# Patient Record
Sex: Male | Born: 1980 | Race: Black or African American | Hispanic: No | Marital: Married | Smoking: Current every day smoker
Health system: Southern US, Community
[De-identification: ages and names within clinical notes are randomized; demographics above are authoritative.]

---

## 1999-08-13 ENCOUNTER — Emergency Department (HOSPITAL_COMMUNITY): Admission: EM | Admit: 1999-08-13 | Discharge: 1999-08-13 | Payer: Self-pay | Admitting: Emergency Medicine

## 2006-06-04 ENCOUNTER — Emergency Department: Payer: Self-pay | Admitting: Emergency Medicine

## 2009-01-22 ENCOUNTER — Emergency Department: Payer: Self-pay | Admitting: Emergency Medicine

## 2009-08-19 ENCOUNTER — Emergency Department: Payer: Self-pay | Admitting: Unknown Physician Specialty

## 2016-01-18 DIAGNOSIS — M25561 Pain in right knee: Secondary | ICD-10-CM | POA: Insufficient documentation

## 2016-01-18 DIAGNOSIS — D649 Anemia, unspecified: Secondary | ICD-10-CM | POA: Insufficient documentation

## 2016-01-18 DIAGNOSIS — B353 Tinea pedis: Secondary | ICD-10-CM | POA: Insufficient documentation

## 2016-01-28 DIAGNOSIS — E559 Vitamin D deficiency, unspecified: Secondary | ICD-10-CM | POA: Insufficient documentation

## 2016-03-01 DIAGNOSIS — G8929 Other chronic pain: Secondary | ICD-10-CM | POA: Insufficient documentation

## 2016-06-11 DIAGNOSIS — M222X1 Patellofemoral disorders, right knee: Secondary | ICD-10-CM | POA: Insufficient documentation

## 2018-09-19 ENCOUNTER — Other Ambulatory Visit: Payer: Self-pay

## 2018-09-19 ENCOUNTER — Encounter: Payer: Self-pay | Admitting: Emergency Medicine

## 2018-09-19 ENCOUNTER — Emergency Department
Admission: EM | Admit: 2018-09-19 | Discharge: 2018-09-19 | Disposition: A | Discharge status: Home / Self Care | Payer: Self-pay | Attending: Emergency Medicine | Admitting: Emergency Medicine

## 2018-09-19 DIAGNOSIS — R05 Cough: Secondary | ICD-10-CM | POA: Insufficient documentation

## 2018-09-19 DIAGNOSIS — J111 Influenza due to unidentified influenza virus with other respiratory manifestations: Secondary | ICD-10-CM | POA: Insufficient documentation

## 2018-09-19 DIAGNOSIS — R0981 Nasal congestion: Secondary | ICD-10-CM | POA: Insufficient documentation

## 2018-09-19 DIAGNOSIS — M791 Myalgia, unspecified site: Secondary | ICD-10-CM | POA: Insufficient documentation

## 2018-09-19 MED ORDER — GUAIFENESIN-CODEINE 100-10 MG/5ML PO SOLN: 5.0000 mL | ORAL | 0 refills | Status: DC | PRN

## 2018-09-19 MED ORDER — OSELTAMIVIR PHOSPHATE 75 MG PO CAPS: 75.0000 mg | ORAL_CAPSULE | Freq: Two times a day (BID) | ORAL | 0 refills | Status: AC

## 2018-09-19 NOTE — ED Notes (Signed)
First Nurse Note: Patient complaining of "flu symptoms".  Wearing adult mask.

## 2018-09-19 NOTE — ED Triage Notes (Signed)
Pt to ED via POV c/o flu like symptoms since yesterday. Pt states that he has had cough and fever since yesterday. Pt states that when he coughs it burns his chest. Pt is in NAD at this time. Pt last took Advil yesterday.

## 2018-09-19 NOTE — ED Provider Notes (Signed)
[facility] Emergency Department Provider Note   ____________________________________________   First MD Initiated Contact with Patient 09/19/18 [phone]     (approximate)  I have reviewed the triage vital signs and the nursing notes.   HISTORY  Chief Complaint Fever   HPI Jonathon Perez is a 38 y.o. male presents to the ED with complaint of sudden onset yesterday of fever, body aches, cough and congestion.  He has had a subjective fever with chills.  He is taken some Advil with minimal relief.  He continues to have body aches.  He is unaware of any known exposure to flu.  He did not take the flu vaccine.  Currently rates his pain as a 4 out of 10.      History reviewed. No pertinent past medical history.  There are no active problems to display for this patient.   History reviewed. No pertinent surgical history.  Prior to Admission medications   Medication Sig Start Date End Date Taking? Authorizing Provider  guaiFENesin-codeine 100-10 MG/5ML syrup Take 5 mLs by mouth every 4 (four) hours as needed for cough. 09/19/18   Tommi Rumps, PA-C  oseltamivir (TAMIFLU) 75 MG capsule Take 1 capsule (75 mg total) by mouth 2 (two) times daily for 5 days. 09/19/18 09/24/18  Tommi Rumps, PA-C    Allergies Patient has no known allergies.  No family history on file.  Social History Social History   Tobacco Use  . Smoking status: Not on file  Substance Use Topics  . Alcohol use: Not on file  . Drug use: Not on file    Review of Systems Constitutional: Objective fever/chills Eyes: No visual changes. ENT: No sore throat. Cardiovascular: Denies chest pain. Respiratory: Denies shortness of breath.  Positive for cough. Gastrointestinal: No abdominal pain.  No nausea, no vomiting.  No diarrhea.  Genitourinary: Negative for dysuria. Musculoskeletal: Negative for back pain. Skin: Negative for rash. Neurological: Negative for headaches, focal  weakness or numbness. ___________________________________________   PHYSICAL EXAM:  VITAL SIGNS: ED Triage Vitals  Enc Vitals Group     BP 09/19/18 0744 (!) 96/54     Pulse Rate 09/19/18 0744 (!) 103     Resp 09/19/18 0744 18     Temp 09/19/18 0744 (!) 100.7 F (38.2 C)     Temp Source 09/19/18 0744 Oral     SpO2 09/19/18 0744 98 %     Weight 09/19/18 0752 160 lb (72.6 kg)     Height 09/19/18 0752 6' (1.829 m)     Head Circumference --      Peak Flow --      Pain Score 09/19/18 0752 4     Pain Loc --      Pain Edu? --      Excl. in GC? --    Constitutional: Alert and oriented. Well appearing and in no acute distress. Eyes: Conjunctivae are normal.  Head: Atraumatic. Nose: Minimal congestion/rhinnorhea.  TMs dull bilaterally. Mouth/Throat: Mucous membranes are moist.  Oropharynx non-erythematous. Neck: No stridor.   Hematological/Lymphatic/Immunilogical: No cervical lymphadenopathy. Cardiovascular: Normal rate, regular rhythm. Grossly normal heart sounds.  Good peripheral circulation. Respiratory: Normal respiratory effort.  No retractions. Lungs CTAB. Gastrointestinal: Soft and nontender. No distention.  Musculoskeletal: No lower extremity tenderness nor edema.  No joint effusions. Neurologic:  Normal speech and language. No gross focal neurologic deficits are appreciated.  Skin:  Skin is warm, dry and intact. No rash noted. Psychiatric: Mood and affect are normal.  Speech and behavior are normal.  ____________________________________________   LABS (all labs ordered are listed, but only abnormal results are displayed)  Labs Reviewed - No data to display ____________________________________________  PROCEDURES  Procedure(s) performed (including Critical Care):  Procedures   ____________________________________________   INITIAL IMPRESSION / ASSESSMENT AND PLAN / ED COURSE  As part of my medical decision making, I reviewed the following data within the  electronic MEDICAL RECORD NUMBER Notes from prior ED visits and [place] Controlled Substance Database     Resents to the ED with complaint of sudden onset of flulike symptoms.  This began yesterday.  Patient has taken Advil with minimal improvement of his symptoms.  Patient was given a prescription for Tamiflu for the next 5 days and Robitussin-AC.  He is encouraged to continue with Tylenol or Advil as needed for body aches, fever or headache.  Increase fluids.  And follow-up with his PCP or [facility] if any continued problems.  He was given a note to remain out of work.  ____________________________________________   FINAL CLINICAL IMPRESSION(S) / ED DIAGNOSES  Final diagnoses:  Influenza     ED Discharge Orders         Ordered    oseltamivir (TAMIFLU) 75 MG capsule  2 times daily     09/19/18 0832    guaiFENesin-codeine 100-10 MG/5ML syrup  Every 4 hours PRN     09/19/18 2446           Note:  This document was prepared using Dragon voice recognition software and may include unintentional dictation errors.    Tommi Rumps, PA-C 09/19/18 0919    Jene Every, MD 09/19/18 1209

## 2018-09-19 NOTE — Discharge Instructions (Addendum)
Follow-up with your primary care provider if any continued problems or can no clinic acute care.  Begin taking Tylenol or ibuprofen as needed for fever and body aches.  Increase fluids to stay hydrated.  A prescription for Tamiflu has been sent to your pharmacy as well as Robitussin-AC for cough.  The cough medication contains a narcotic and you should not take this and drive or operate machinery as it could cause increase in injuries.  While you are running fever you are contagious.  Avoid being around people while you are sick to prevent spread of influenza.

## 2018-09-19 NOTE — ED Notes (Signed)
See triage note  States he developed fever,body aches and slight cough  sxs' started yesterday  Low grade fever on arrival

## 2019-05-27 ENCOUNTER — Other Ambulatory Visit: Payer: Self-pay

## 2019-05-27 DIAGNOSIS — Z20822 Contact with and (suspected) exposure to covid-19: Secondary | ICD-10-CM

## 2019-05-28 LAB — NOVEL CORONAVIRUS, NAA: SARS-CoV-2, NAA: NOT DETECTED

## 2019-05-28 LAB — INPATIENT

## 2019-11-29 ENCOUNTER — Ambulatory Visit: Payer: Self-pay | Attending: Internal Medicine

## 2020-12-08 ENCOUNTER — Encounter: Payer: Self-pay | Admitting: Emergency Medicine

## 2020-12-08 ENCOUNTER — Emergency Department: Payer: Self-pay

## 2020-12-08 ENCOUNTER — Emergency Department
Admission: EM | Admit: 2020-12-08 | Discharge: 2020-12-08 | Disposition: A | Discharge status: Home / Self Care | Payer: Self-pay | Attending: Emergency Medicine | Admitting: Emergency Medicine

## 2020-12-08 ENCOUNTER — Other Ambulatory Visit: Payer: Self-pay

## 2020-12-08 DIAGNOSIS — R519 Headache, unspecified: Secondary | ICD-10-CM | POA: Insufficient documentation

## 2020-12-08 DIAGNOSIS — R5383 Other fatigue: Secondary | ICD-10-CM | POA: Insufficient documentation

## 2020-12-08 NOTE — ED Provider Notes (Signed)
[facility] Emergency Department Provider Note   ____________________________________________    I have reviewed the triage vital signs and the nursing notes.   HISTORY  Chief Complaint Headache     HPI Jonathon Perez is a 40 y.o. male who presents with complaints of headache.  Patient reports over the last month he has had intermittent headaches up to 3 times a week and it is atypical for him.  Does seem to be positional at times.  No history of migraines.  No fevers or chills.  Also complains of significant fatigue  History reviewed. No pertinent past medical history.  There are no problems to display for this patient.   History reviewed. No pertinent surgical history.  Prior to Admission medications   Medication Sig Start Date End Date Taking? Authorizing Provider  guaiFENesin-codeine 100-10 MG/5ML syrup Take 5 mLs by mouth every 4 (four) hours as needed for cough. 09/19/18   Tommi Rumps, PA-C     Allergies Patient has no known allergies.  No family history on file.  Social History    Review of Systems  Constitutional: No fever/chills, fatigue Eyes: No visual changes.  ENT: No sore throat. Cardiovascular: Denies chest pain. Respiratory: Denies shortness of breath. Gastrointestinal: No abdominal pain.  No nausea, no vomiting.   Genitourinary: Negative for dysuria. Musculoskeletal: Negative for back pain. Skin: Negative for rash. Neurological: No neurodeficit   ____________________________________________   PHYSICAL EXAM:  VITAL SIGNS: ED Triage Vitals  Enc Vitals Group     BP 12/08/20 1646 136/82     Pulse Rate 12/08/20 1646 81     Resp 12/08/20 1646 15     Temp 12/08/20 1646 98.4 F (36.9 C)     Temp Source 12/08/20 1646 Oral     SpO2 12/08/20 1646 98 %     Weight 12/08/20 1643 72.6 kg (160 lb 0.9 oz)     Height 12/08/20 1643 1.829 m (6')     Head Circumference --      Peak Flow --      Pain Score  12/08/20 1643 0     Pain Loc --      Pain Edu? --      Excl. in GC? --     Constitutional: Alert and oriented. No acute distress. Pleasant and interactive Eyes: Conjunctivae are normal.  PERRLA, EOMI Head: Atraumatic.  Neck:  Painless ROM Cardiovascular: Normal rate, regular rhythm. Respiratory: Normal respiratory effort.  No retractions.  Musculoskeletal: No lower extremity tenderness nor edema.  Warm and well perfused Neurologic:  Normal speech and language. No gross focal neurologic deficits are appreciated.  Skin:  Skin is warm, dry and intact. No rash noted. Psychiatric: Mood and affect are normal. Speech and behavior are normal.  ____________________________________________   LABS (all labs ordered are listed, but only abnormal results are displayed)  Labs Reviewed - No data to display ____________________________________________  EKG  None ____________________________________________  RADIOLOGY  CT head reviewed by me, unremarkable ____________________________________________   PROCEDURES  Procedure(s) performed: No  Procedures   Critical Care performed: No ____________________________________________   INITIAL IMPRESSION / ASSESSMENT AND PLAN / ED COURSE  Pertinent labs & imaging results that were available during my care of the patient were reviewed by me and considered in my medical decision making (see chart for details).  Patient overall well-appearing and in no acute distress, vital signs, exam quite reassuring.  Frequent new headache that patient is developing in the morning over the  course of a month with some positional features, CT head reassuring  Have referred the patient to PCP for further evaluation of fatigue and headache    ____________________________________________   FINAL CLINICAL IMPRESSION(S) / ED DIAGNOSES  Final diagnoses:  Nonintractable headache, unspecified chronicity pattern, unspecified headache type  Fatigue,  unspecified type        Note:  This document was prepared using Dragon voice recognition software and may include unintentional dictation errors.   Jene Every, MD 12/08/20 2129

## 2020-12-08 NOTE — ED Notes (Addendum)
Pt states that he has had an on and off headache for the last month and does not know what triggers it. Pt states sometimes light makes it worse, but only behind the right eye. Pt walked to room, no deficits noted. Pt states he has not yet been seen for this complaint.

## 2020-12-08 NOTE — ED Triage Notes (Signed)
C/O intermittent right sided headache  X 1 month.  States he has been having symptoms around 3 times a week.  Had headache earlier today, but currently denies complaint.   AAOx3.  Skin warm and dry.  MAE equally and strong. NAD

## 2021-04-07 ENCOUNTER — Other Ambulatory Visit: Payer: Self-pay

## 2021-04-07 ENCOUNTER — Emergency Department
Admission: EM | Admit: 2021-04-07 | Discharge: 2021-04-07 | Disposition: A | Discharge status: Home / Self Care | Payer: Self-pay | Attending: Emergency Medicine | Admitting: Emergency Medicine

## 2021-04-07 DIAGNOSIS — R202 Paresthesia of skin: Secondary | ICD-10-CM | POA: Insufficient documentation

## 2021-04-07 DIAGNOSIS — R569 Unspecified convulsions: Secondary | ICD-10-CM | POA: Insufficient documentation

## 2021-04-07 DIAGNOSIS — S39012A Strain of muscle, fascia and tendon of lower back, initial encounter: Secondary | ICD-10-CM | POA: Insufficient documentation

## 2021-04-07 DIAGNOSIS — Y9289 Other specified places as the place of occurrence of the external cause: Secondary | ICD-10-CM | POA: Insufficient documentation

## 2021-04-07 DIAGNOSIS — W1849XA Other slipping, tripping and stumbling without falling, initial encounter: Secondary | ICD-10-CM | POA: Insufficient documentation

## 2021-04-07 MED ORDER — MELOXICAM 15 MG PO TABS: 15.0000 mg | ORAL_TABLET | Freq: Every day | ORAL | 0 refills | Status: DC

## 2021-04-07 MED ORDER — METHOCARBAMOL 500 MG PO TABS: 500.0000 mg | ORAL_TABLET | Freq: Four times a day (QID) | ORAL | 0 refills | Status: DC

## 2021-04-07 NOTE — ED Provider Notes (Signed)
[facility] Emergency Department Provider Note  ____________________________________________  Time seen: Approximately 6:50 PM  I have reviewed the triage vital signs and the nursing notes.   HISTORY  Chief Complaint Back Pain    HPI Jonathon Perez is a 40 y.o. male Pt complains of low back pain.  Patient resents with nontraumatic lower back pain.  Seizure paresthesias.  No fevers or chills.  No URI symptoms.  No urinary or GI complaints.  Patient states that symptoms can last night after sneezing.         History reviewed. No pertinent past medical history.  There are no problems to display for this patient.   History reviewed. No pertinent surgical history.  Prior to Admission medications   Medication Sig Start Date End Date Taking? Authorizing Provider  meloxicam (MOBIC) 15 MG tablet Take 1 tablet (15 mg total) by mouth daily. 04/07/21  Yes Pansie Guggisberg, Delorise Royals, PA-C  methocarbamol (ROBAXIN) 500 MG tablet Take 1 tablet (500 mg total) by mouth 4 (four) times daily. 04/07/21  Yes Valerian Jewel, Delorise Royals, PA-C  guaiFENesin-codeine 100-10 MG/5ML syrup Take 5 mLs by mouth every 4 (four) hours as needed for cough. 09/19/18   Tommi Rumps, PA-C    Allergies Patient has no known allergies.  No family history on file.  Social History     Review of Systems  Constitutional: No fever/chills Eyes: No visual changes. No discharge ENT: No upper respiratory complaints. Cardiovascular: no chest pain. Respiratory: no cough. No SOB. Gastrointestinal: No abdominal pain.  No nausea, no vomiting.  No diarrhea.  No constipation. Genitourinary: Negative for dysuria. No hematuria Musculoskeletal: Positive for lower back pain Skin: Negative for rash, abrasions, lacerations, ecchymosis. Neurological: Negative for headaches, focal weakness or numbness.  10 System ROS otherwise negative.  ____________________________________________   PHYSICAL  EXAM:  VITAL SIGNS: ED Triage Vitals [04/07/21 1847]  Enc Vitals Group     BP (!) 128/94     Pulse Rate 92     Resp 18     Temp 98 F (36.7 C)     Temp src      SpO2 100 %     Weight      Height      Head Circumference      Peak Flow      Pain Score 7     Pain Loc      Pain Edu?      Excl. in GC?      Constitutional: Alert and oriented. Well appearing and in no acute distress. Eyes: Conjunctivae are normal. PERRL. EOMI. Head: Atraumatic. ENT:      Ears:       Nose: No congestion/rhinnorhea.      Mouth/Throat: Mucous membranes are moist.  Neck: No stridor.    Cardiovascular: Normal rate, regular rhythm. Normal S1 and S2.  Good peripheral circulation. Respiratory: Normal respiratory effort without tachypnea or retractions. Lungs CTAB. Good air entry to the bases with no decreased or absent breath sounds. Gastrointestinal: Bowel sounds 4 quadrants. Soft and nontender to palpation. No guarding or rigidity. No palpable masses. No distention. No CVA tenderness. Musculoskeletal: Full range of motion to all extremities. No gross deformities appreciated.  Visualization of the lower back reveals no visible signs of trauma.  Diffuse tenderness primarily in the bilateral paraspinal segments.  Palpable spasms appreciated.  No midline tenderness or palpable abnormality.  No step-off.  Dorsalis pedis pulses sensation intact and equal bilateral lower extremities Neurologic:  Normal  speech and language. No gross focal neurologic deficits are appreciated.  Skin:  Skin is warm, dry and intact. No rash noted. Psychiatric: Mood and affect are normal. Speech and behavior are normal. Patient exhibits appropriate insight and judgement.   ____________________________________________   LABS (all labs ordered are listed, but only abnormal results are displayed)  Labs Reviewed - No data to  display ____________________________________________  EKG   ____________________________________________  RADIOLOGY   No results found.  ____________________________________________    PROCEDURES  Procedure(s) performed:    Procedures    Medications - No data to display   ____________________________________________   INITIAL IMPRESSION / ASSESSMENT AND PLAN / ED COURSE  Pertinent labs & imaging results that were available during my care of the patient were reviewed by me and considered in my medical decision making (see chart for details).  Review of the [place] CSRS was performed in accordance of the [facility] prior to dispensing any controlled drugs.           Patient's diagnosis is consistent with lumbar strain.  Patient presents emergency department low back pain after stumbling around last night.  No concerning neuro deficits.  Patient has palpable spasms on exam.  Anti-inflammatory muscle relaxer will be prescribed for the patient at this time.  Follow-up with primary care as needed..  Patient is given ED precautions to return to the ED for any worsening or new symptoms.     ____________________________________________  FINAL CLINICAL IMPRESSION(S) / ED DIAGNOSES  Final diagnoses:  Strain of lumbar region, initial encounter      NEW MEDICATIONS STARTED DURING THIS VISIT:  ED Discharge Orders          Ordered    meloxicam (MOBIC) 15 MG tablet  Daily        04/07/21 1852    methocarbamol (ROBAXIN) 500 MG tablet  4 times daily        04/07/21 1852                This chart was dictated using voice recognition software/Dragon. Despite best efforts to proofread, errors can occur which can change the meaning. Any change was purely unintentional.    Racheal Patches, PA-C 04/07/21 1853    Minna Antis, MD 04/07/21 [phone]

## 2021-04-07 NOTE — ED Triage Notes (Signed)
Pt comes with c/o lower back pain. Pt states he was laying in bed and sneezed. Pt states his body tensed up and he felt something pop. Pt states 7/10 pain

## 2021-06-23 ENCOUNTER — Emergency Department: Payer: Self-pay

## 2021-06-23 ENCOUNTER — Emergency Department
Admission: EM | Admit: 2021-06-23 | Discharge: 2021-06-23 | Disposition: A | Discharge status: Home / Self Care | Payer: Self-pay | Attending: Emergency Medicine | Admitting: Emergency Medicine

## 2021-06-23 ENCOUNTER — Other Ambulatory Visit: Payer: Self-pay

## 2021-06-23 ENCOUNTER — Encounter: Payer: Self-pay | Admitting: Emergency Medicine

## 2021-06-23 DIAGNOSIS — Y990 Civilian activity done for income or pay: Secondary | ICD-10-CM | POA: Insufficient documentation

## 2021-06-23 DIAGNOSIS — M25531 Pain in right wrist: Secondary | ICD-10-CM | POA: Insufficient documentation

## 2021-06-23 DIAGNOSIS — F17210 Nicotine dependence, cigarettes, uncomplicated: Secondary | ICD-10-CM | POA: Insufficient documentation

## 2021-06-23 MED ORDER — MELOXICAM 15 MG PO TABS: 15.0000 mg | ORAL_TABLET | Freq: Every day | ORAL | 1 refills | Status: AC

## 2021-06-23 NOTE — ED Triage Notes (Signed)
Pt to ED from home c/o right wrist pain x1 week.  Denies known injury recently but does heavy repetitive motion for work.  Did fall and injury wrist years ago.

## 2021-06-23 NOTE — ED Provider Notes (Signed)
[facility]-EMERGENCY DEPARTMENT  ____________________________________________  Time seen: Approximately 9:50 PM  I have reviewed the triage vital signs and the nursing notes.   HISTORY  Chief Complaint Wrist Pain   Historian Patient     HPI Jonathon Perez is a 40 y.o. male presents to the emergency department with pain along left first dorsal extensor compartment.  Patient engages in repetitive activities at work.  No falls or mechanisms of trauma recently.  Patient states that he did have a right wrist fracture several years ago.  No numbness or tingling in the upper extremities.   History reviewed. No pertinent past medical history.   Immunizations up to date:  Yes.     History reviewed. No pertinent past medical history.  There are no problems to display for this patient.   History reviewed. No pertinent surgical history.  Prior to Admission medications   Medication Sig Start Date End Date Taking? Authorizing Provider  meloxicam (MOBIC) 15 MG tablet Take 1 tablet (15 mg total) by mouth daily. 06/23/21 06/23/22 Yes Pia Mau M, PA-C  guaiFENesin-codeine 100-10 MG/5ML syrup Take 5 mLs by mouth every 4 (four) hours as needed for cough. 09/19/18   Tommi Rumps, PA-C  methocarbamol (ROBAXIN) 500 MG tablet Take 1 tablet (500 mg total) by mouth 4 (four) times daily. 04/07/21   Cuthriell, Delorise Royals, PA-C    Allergies Patient has no known allergies.  History reviewed. No pertinent family history.  Social History Social History   Tobacco Use   Smoking status: Every Day    Packs/day: 0.50    Types: Cigarettes   Smokeless tobacco: Never  Substance Use Topics   Alcohol use: Never   Drug use: Never     Review of Systems  Constitutional: No fever/chills Eyes:  No discharge ENT: No upper respiratory complaints. Respiratory: no cough. No SOB/ use of accessory muscles to breath Gastrointestinal:   No nausea, no vomiting.  No diarrhea.  No  constipation. Musculoskeletal: Patient has wrist pain.  Skin: Negative for rash, abrasions, lacerations, ecchymosis.   ____________________________________________   PHYSICAL EXAM:  VITAL SIGNS: ED Triage Vitals  Enc Vitals Group     BP 06/23/21 1906 (!) 133/91     Pulse Rate 06/23/21 1906 91     Resp 06/23/21 1906 18     Temp 06/23/21 1906 98.3 F (36.8 C)     Temp Source 06/23/21 1906 Oral     SpO2 06/23/21 1906 98 %     Weight 06/23/21 1906 160 lb (72.6 kg)     Height 06/23/21 1906 6' (1.829 m)     Head Circumference --      Peak Flow --      Pain Score 06/23/21 1918 6     Pain Loc --      Pain Edu? --      Excl. in GC? --      Constitutional: Alert and oriented. Well appearing and in no acute distress. Eyes: Conjunctivae are normal. PERRL. EOMI. Head: Atraumatic. ENT: Cardiovascular: Normal rate, regular rhythm. Normal S1 and S2.  Good peripheral circulation. Respiratory: Normal respiratory effort without tachypnea or retractions. Lungs CTAB. Good air entry to the bases with no decreased or absent breath sounds Gastrointestinal: Bowel sounds x 4 quadrants. Soft and nontender to palpation. No guarding or rigidity. No distention. Musculoskeletal: Patient has a positive Finkelstein test.  Palpable radial and ulnar pulses bilaterally and symmetrically. Neurologic:  Normal for age. No gross focal neurologic deficits are appreciated.  Skin:  Skin is warm, dry and intact. No rash noted. Psychiatric: Mood and affect are normal for age. Speech and behavior are normal.   ____________________________________________   LABS (all labs ordered are listed, but only abnormal results are displayed)  Labs Reviewed - No data to display ____________________________________________  EKG   ____________________________________________  RADIOLOGY Geraldo Pitter, personally viewed and evaluated these images (plain radiographs) as part of my medical decision making, as well as  reviewing the written report by the radiologist.  DG Wrist Complete Right  Result Date: 06/23/2021 CLINICAL DATA:  Right wrist pain x1 week EXAM: RIGHT WRIST - COMPLETE 3+ VIEW COMPARISON:  None. FINDINGS: No fracture or dislocation is seen. Mild degenerative changes along the lateral (radial) wrist. A tiny well corticated osseous density along the radiocarpal articulation raises the possibility of prior trauma, but not acute. The visualized soft tissues are unremarkable. IMPRESSION: No acute fracture or dislocation. Sequela of prior trauma along the lateral wrist. Electronically Signed   By: Charline Bills M.D.   On: 06/23/2021 19:48    ____________________________________________    PROCEDURES  Procedure(s) performed:     Procedures     Medications - No data to display   ____________________________________________   INITIAL IMPRESSION / ASSESSMENT AND PLAN / ED COURSE  Pertinent labs & imaging results that were available during my care of the patient were reviewed by me and considered in my medical decision making (see chart for details).      Assessment and plan Wrist pain 40 year old male presents to the emergency department with acute right wrist pain for the past week.  He had a positive Finkelstein test.  Suspect de Quervain's tenosynovitis.  Patient was placed in a Velcro wrist splint and meloxicam was prescribed.  He was advised to follow-up with orthopedics, Dr. Okey Dupre as needed.     ____________________________________________  FINAL CLINICAL IMPRESSION(S) / ED DIAGNOSES  Final diagnoses:  Right wrist pain      NEW MEDICATIONS STARTED DURING THIS VISIT:  ED Discharge Orders          Ordered    meloxicam (MOBIC) 15 MG tablet  Daily        06/23/21 2140                This chart was dictated using voice recognition software/Dragon. Despite best efforts to proofread, errors can occur which can change the meaning. Any change was purely  unintentional.     Orvil Feil, PA-C 06/23/21 2152    Arnaldo Natal, MD 06/23/21 [phone]

## 2021-06-23 NOTE — Discharge Instructions (Addendum)
Take meloxicam once daily for pain and inflammation. Wear wrist splint at night. Apply ice while resting at night.

## 2021-10-28 ENCOUNTER — Emergency Department
Admission: EM | Admit: 2021-10-28 | Discharge: 2021-10-28 | Disposition: A | Discharge status: Home / Self Care | Payer: Self-pay | Attending: Emergency Medicine | Admitting: Emergency Medicine

## 2021-10-28 ENCOUNTER — Encounter: Payer: Self-pay | Admitting: Emergency Medicine

## 2021-10-28 ENCOUNTER — Other Ambulatory Visit: Payer: Self-pay

## 2021-10-28 ENCOUNTER — Emergency Department: Payer: Self-pay

## 2021-10-28 DIAGNOSIS — K920 Hematemesis: Secondary | ICD-10-CM | POA: Insufficient documentation

## 2021-10-28 DIAGNOSIS — M79642 Pain in left hand: Secondary | ICD-10-CM | POA: Insufficient documentation

## 2021-10-28 MED ORDER — DICLOFENAC SODIUM 1 % EX GEL: 4.0000 g | Freq: Four times a day (QID) | CUTANEOUS | 0 refills | Status: DC

## 2021-10-28 MED ORDER — PANTOPRAZOLE SODIUM 20 MG PO TBEC: 20.0000 mg | DELAYED_RELEASE_TABLET | Freq: Two times a day (BID) | ORAL | 28 refills | Status: DC

## 2021-10-28 NOTE — Discharge Instructions (Addendum)
Apply Voltaren gel to left hand. ?Take Protonix twice daily. ?

## 2021-10-28 NOTE — ED Triage Notes (Signed)
Pt comes into the ED via POV c/o left hand pain that has been ongoing x 1 month.  Pt states it is specifically his left ring and middle finger that feel "stiff" and hurt.  Pt denies any known injury to the hand and is able to move all fingers at this time.  Pt also admits that he was drinking last night and when he had an episode of emesis, his wife told him there was a little blood present in it.  Pt in NAD at this time with even and unlabored respirations.  Pt states he only had one episode of the emesis.   ?

## 2021-10-28 NOTE — ED Provider Notes (Signed)
? ?[facility] ?Provider Note ? ?Patient Contact: 6:23 PM (approximate) ? ? ?History  ? ?Hand Pain and Hematemesis (/) ? ? ?HPI ? ?Jonathon Perez is a 41 y.o. male presents to the emergency department with 2 main complaints.  Patient is having stiffness and pain with third and fourth digit extension.  Patient denies falls or mechanisms of trauma to his knowledge.  He states that he became intoxicated last night and is secondarily concerned about an episode of hematemesis that occurred.  He states that he only had a small amount of blood in his vomit.  He denies chest pain, chest tightness or abdominal pain and denies history of upper GI bleed. ? ?  ? ? ?Physical Exam  ? ?Triage Vital Signs: ?ED Triage Vitals  ?Enc Vitals Group  ?   BP 10/28/21 1730 116/79  ?   Pulse Rate 10/28/21 1730 98  ?   Resp 10/28/21 1730 17  ?   Temp 10/28/21 1730 98.4 ?F (36.9 ?C)  ?   Temp Source 10/28/21 1730 Oral  ?   SpO2 10/28/21 1730 97 %  ?   Weight 10/28/21 1728 160 lb 0.9 oz (72.6 kg)  ?   Height 10/28/21 1728 6' (1.829 m)  ?   Head Circumference --   ?   Peak Flow --   ?   Pain Score 10/28/21 1728 8  ?   Pain Loc --   ?   Pain Edu? --   ?   Excl. in GC? --   ? ? ?Most recent vital signs: ?Vitals:  ? 10/28/21 1730  ?BP: 116/79  ?Pulse: 98  ?Resp: 17  ?Temp: 98.4 ?F (36.9 ?C)  ?SpO2: 97%  ? ? ? ?General: Alert and in no acute distress. ?Eyes:  PERRL. EOMI. ?Head: No acute traumatic findings ?ENT: ?     Ears: Tms are pearly.  ?     Nose: No congestion/rhinnorhea. ?     Mouth/Throat: Mucous membranes are moist. ?Neck: No stridor. No cervical spine tenderness to palpation. ?Cardiovascular:  Good peripheral perfusion ?Respiratory: Normal respiratory effort without tachypnea or retractions. Lungs CTAB. Good air entry to the bases with no decreased or absent breath sounds. ?Gastrointestinal: Bowel sounds ?4 quadrants. Soft and nontender to palpation. No guarding or rigidity. No palpable masses. No distention. No  CVA tenderness. ?Musculoskeletal: Full range of motion to all extremities.  Patient has pain with left third and fourth digit extension.  No thumb involvement.  Palpable radial and ulnar pulses bilaterally and symmetrically. ?Neurologic:  No gross focal neurologic deficits are appreciated.  ?Skin:   No rash noted ?Other: ? ? ?ED Results / Procedures / Treatments  ? ?Labs ?(all labs ordered are listed, but only abnormal results are displayed) ?Labs Reviewed - No data to display ? ? ? ? ?RADIOLOGY ? ?I personally viewed and evaluated these images as part of my medical decision making, as well as reviewing the written report by the radiologist. ? ?ED Provider Interpretation: I personally reviewed x-ray of patient's left hand and there was no acute bony abnormality visualized. ? ? ?PROCEDURES: ? ?Critical Care performed: No ? ?Procedures ? ? ?MEDICATIONS ORDERED IN ED: ?Medications - No data to display ? ? ?IMPRESSION / MDM / ASSESSMENT AND PLAN / ED COURSE  ?I reviewed the triage vital signs and the nursing notes. ?             ?               ? ?  Assessment and plan ?Hand pain ?Hematemesis ? ?Differential diagnosis includes, but is not limited to, arthritis, carpal tunnel, Dupuytren's, gastritis secondary to alcohol... ? ?42 year old male presents to the emergency department with left hand pain and mild hematemesis following alcohol use. ? ?Vital signs are reassuring at triage.  On physical exam, patient was alert, active and nontoxic-appearing.  I personally reviewed x-ray of left hand and no bony abnormality was visualized.  Patient was started on Protonix for mild hematemesis likely secondary to alcohol use.  Return precautions were given to return with new or worsening symptoms.  All patient questions were answered. ?  ? ? ?FINAL CLINICAL IMPRESSION(S) / ED DIAGNOSES  ? ?Final diagnoses:  ?Left hand pain  ? ? ? ?Rx / DC Orders  ? ?ED Discharge Orders   ? ?      Ordered  ?  pantoprazole (PROTONIX) 20 MG tablet  2  times daily       ? 10/28/21 1858  ?  diclofenac Sodium (VOLTAREN) 1 % GEL  4 times daily       ? 10/28/21 1858  ? ?  ?  ? ?  ? ? ? ?Note:  This document was prepared using Dragon voice recognition software and may include unintentional dictation errors. ?  ?Orvil Feil, PA-C ?10/28/21 1907 ? ?  ?Jene Every, MD ?10/28/21 2005 ? ?

## 2022-01-10 ENCOUNTER — Encounter: Payer: Self-pay | Admitting: Emergency Medicine

## 2022-01-10 ENCOUNTER — Emergency Department
Admission: EM | Admit: 2022-01-10 | Discharge: 2022-01-10 | Disposition: A | Discharge status: Home / Self Care | Payer: 59 | Attending: Emergency Medicine | Admitting: Emergency Medicine

## 2022-01-10 DIAGNOSIS — X58XXXA Exposure to other specified factors, initial encounter: Secondary | ICD-10-CM | POA: Diagnosis not present

## 2022-01-10 DIAGNOSIS — R21 Rash and other nonspecific skin eruption: Secondary | ICD-10-CM | POA: Insufficient documentation

## 2022-01-10 DIAGNOSIS — Z5321 Procedure and treatment not carried out due to patient leaving prior to being seen by health care provider: Secondary | ICD-10-CM | POA: Diagnosis not present

## 2022-01-10 DIAGNOSIS — Y990 Civilian activity done for income or pay: Secondary | ICD-10-CM | POA: Insufficient documentation

## 2022-01-10 MED ORDER — DIPHENHYDRAMINE HCL 25 MG PO CAPS
50.0000 mg | ORAL_CAPSULE | Freq: Once | ORAL | Status: AC
  Administered 2022-01-10: 50 mg via ORAL
  Filled 2022-01-10: qty 2

## 2022-01-10 MED ORDER — PREDNISONE 20 MG PO TABS
60.0000 mg | ORAL_TABLET | Freq: Once | ORAL | Status: AC
  Administered 2022-01-10: 60 mg via ORAL
  Filled 2022-01-10: qty 3

## 2022-01-10 NOTE — ED Triage Notes (Signed)
Pt reports cleaning solution splashing on him at work around midnight and went home, showered then noticed rash all over body. Pt with reddened rash with whelps all over body, worse on arms, back and sides of neck. Pt denies SOB, airway clear with no obvious oral swelling on assessment.

## 2022-01-11 ENCOUNTER — Emergency Department
Admission: EM | Admit: 2022-01-11 | Discharge: 2022-01-11 | Disposition: A | Discharge status: Home / Self Care | Payer: 59 | Attending: Emergency Medicine | Admitting: Emergency Medicine

## 2022-01-11 ENCOUNTER — Encounter: Payer: Self-pay | Admitting: Emergency Medicine

## 2022-01-11 ENCOUNTER — Other Ambulatory Visit: Payer: Self-pay

## 2022-01-11 DIAGNOSIS — L509 Urticaria, unspecified: Secondary | ICD-10-CM | POA: Diagnosis not present

## 2022-01-11 DIAGNOSIS — R21 Rash and other nonspecific skin eruption: Secondary | ICD-10-CM | POA: Diagnosis not present

## 2022-01-11 MED ORDER — CETIRIZINE HCL 10 MG PO TABS: 10.0000 mg | ORAL_TABLET | Freq: Two times a day (BID) | ORAL | 0 refills | Status: DC

## 2022-01-11 MED ORDER — FAMOTIDINE 20 MG PO TABS: 20.0000 mg | ORAL_TABLET | Freq: Two times a day (BID) | ORAL | 0 refills | Status: DC

## 2022-01-11 NOTE — ED Triage Notes (Signed)
Patient ambulatory to triage with steady gait, without difficulty or distress noted; pt reports itchy rash noted since yesterday with no known cause; took 2-50mg  benadryl at midnight; st was here last night but left before being seen due to wait time

## 2022-01-11 NOTE — ED Notes (Signed)
Pt DC to home. DC instructions reviewed with all questions answered. Pt verbalizes understanding. Pt ambulatory out of dept with steady gait.  

## 2022-01-11 NOTE — Discharge Instructions (Addendum)
You have urticaria which can be idiopathic or from an allergen.  You can take the Benadryl at night which may help you sleep.  Please start taking cetirizine 10 mg twice a day.  If this is not helping you can increase to 20 mg twice a day.  You can also add Pepcid 20 mg twice a day.  If your symptoms or not improving please follow-up with dermatology.

## 2022-01-11 NOTE — ED Provider Notes (Addendum)
[facility] Provider Note    Event Date/Time   First MD Initiated Contact with Patient 01/11/22 0140     (approximate)   History   Rash   HPI  Jonathon Perez is a 41 y.o. male with no significant past medical history presents with rash.  Symptoms started yesterday.  Patient was at work yesterday when he was splashed with a detergent.  After which he noticed the rash.  Rash is itchy.  It is on his trunk and face and has been moving around.  Seems like every where he itches starts to have the rash.  Denies oral lesions fevers chills recent illnesses.  Denies facial swelling difficulty swallowing difficulty breathing nausea vomiting.  No new other exposures    History reviewed. No pertinent past medical history.  There are no problems to display for this patient.    Physical Exam  Triage Vital Signs: ED Triage Vitals  Enc Vitals Group     BP 01/11/22 0116 (!) 124/98     Pulse Rate 01/11/22 0116 84     Resp 01/11/22 0116 20     Temp 01/11/22 0116 98.2 F (36.8 C)     Temp Source 01/11/22 0116 Oral     SpO2 01/11/22 0116 97 %     Weight 01/11/22 0117 159 lb 13.3 oz (72.5 kg)     Height 01/11/22 0117 6' (1.829 m)     Head Circumference --      Peak Flow --      Pain Score 01/11/22 0117 0     Pain Loc --      Pain Edu? --      Excl. in GC? --     Most recent vital signs: Vitals:   01/11/22 0116  BP: (!) 124/98  Pulse: 84  Resp: 20  Temp: 98.2 F (36.8 C)  SpO2: 97%     General: Awake, no distress.  CV:  Good peripheral perfusion.  Resp:  Normal effort.  Abd:  No distention. Neuro:             Awake, Alert, Oriented x 3  Other:  Patches of urticaria scattered along the chest abdomen and face  No oral lesions   ED Results / Procedures / Treatments  Labs (all labs ordered are listed, but only abnormal results are displayed) Labs Reviewed - No data to display   EKG     RADIOLOGY    PROCEDURES:  Critical Care  performed: No     MEDICATIONS ORDERED IN ED: Medications - No data to display   IMPRESSION / MDM / ASSESSMENT AND PLAN / ED COURSE  I reviewed the triage vital signs and the nursing notes.                              Patient's presentation is most consistent with acute, uncomplicated illness.  Differential diagnosis includes, but is not limited to, allergic reaction, idiopathic urticaria, viral illness  Patient is a 41 year old male presenting with urticaria.  Symptoms been going on since yesterday.  No symptoms of more severe allergic reaction including dyspnea nausea vomiting facial swelling difficulty swallowing.  Was splashed with a detergent while at work yesterday which could have been the inciting factor.  No recent illnesses fevers chills.  No history of similar.  Has been taking Benadryl with minimal relief.  The rash is quite pruritic.  It is located on the  chest back and face.  He has no oral lesions.  His vitals are within normal limits.  We will treat with high-dose H1 and H2 blockers.  Recommend he continue to use Benadryl before bed but switch to cetirizine during the day to limit the sedating effects.  Advised that he follow-up with dermatology if symptoms or not improving.       FINAL CLINICAL IMPRESSION(S) / ED DIAGNOSES   Final diagnoses:  Urticaria     Rx / DC Orders   ED Discharge Orders          Ordered    cetirizine (ZYRTEC ALLERGY) 10 MG tablet  2 times daily        01/11/22 0148    famotidine (PEPCID) 20 MG tablet  2 times daily        01/11/22 0148             Note:  This document was prepared using Dragon voice recognition software and may include unintentional dictation errors.   Georga Hacking, MD 01/11/22 0151    Georga Hacking, MD 01/11/22 [phone]

## 2022-10-11 ENCOUNTER — Other Ambulatory Visit: Payer: Self-pay

## 2022-10-11 ENCOUNTER — Emergency Department
Admission: EM | Admit: 2022-10-11 | Discharge: 2022-10-11 | Disposition: A | Discharge status: Home / Self Care | Payer: BC Managed Care – PPO | Attending: Emergency Medicine | Admitting: Emergency Medicine

## 2022-10-11 ENCOUNTER — Encounter: Payer: Self-pay | Admitting: Emergency Medicine

## 2022-10-11 DIAGNOSIS — R202 Paresthesia of skin: Secondary | ICD-10-CM | POA: Diagnosis not present

## 2022-10-11 DIAGNOSIS — R739 Hyperglycemia, unspecified: Secondary | ICD-10-CM | POA: Diagnosis not present

## 2022-10-11 DIAGNOSIS — R200 Anesthesia of skin: Secondary | ICD-10-CM | POA: Diagnosis not present

## 2022-10-11 LAB — CBC
HCT: 46.9 % (ref 39.0–52.0)
Hemoglobin: 15.5 g/dL (ref 13.0–17.0)
MCH: 29.3 pg (ref 26.0–34.0)
MCHC: 33 g/dL (ref 30.0–36.0)
MCV: 88.7 fL (ref 80.0–100.0)
Platelets: 346 10*3/uL (ref 150–400)
RBC: 5.29 MIL/uL (ref 4.22–5.81)
RDW: 13 % (ref 11.5–15.5)
WBC: 7.8 10*3/uL (ref 4.0–10.5)
nRBC: 0 % (ref 0.0–0.2)

## 2022-10-11 LAB — BASIC METABOLIC PANEL
Anion gap: 11 (ref 5–15)
BUN: 12 mg/dL (ref 6–20)
CO2: 24 mmol/L (ref 22–32)
Calcium: 9.5 mg/dL (ref 8.9–10.3)
Chloride: 103 mmol/L (ref 98–111)
Creatinine, Ser: 1.23 mg/dL (ref 0.61–1.24)
GFR, Estimated: >60 mL/min (ref >60)
Glucose, Bld: 160 mg/dL — ABNORMAL HIGH (ref 70–99)
Potassium: 3.4 mmol/L — ABNORMAL LOW (ref 3.5–5.1)
Sodium: 138 mmol/L (ref 135–145)

## 2022-10-11 NOTE — ED Provider Notes (Signed)
[facility] Provider Note    Event Date/Time   First MD Initiated Contact with Patient 10/11/22 1941     (approximate)  History   Chief Complaint: Numbness  HPI  Jonathon Perez is a 42 y.o. male with no significant past medical history presents to the emergency department with concerns for numbness and tingling in his fingers.  According to the patient for the past month or so he had numbness and tingling mostly in his ring finger of his left hand.  He states last night he woke up after sleeping during the day because he works third shift and felt like he was having some tingling in his right pointer finger.  States he did not go to work last night.  Patient denies any headache denies any neck pain.  He does state for the past 6 months or so he has been sleeping more than he feels is normal.  But does admit that he works third shift and is often very tired when he gets home.  Physical Exam   Triage Vital Signs: ED Triage Vitals  Enc Vitals Group     BP 10/11/22 1602 (!) 111/90     Pulse Rate 10/11/22 1602 (!) 108     Resp 10/11/22 1602 20     Temp 10/11/22 1602 98.5 F (36.9 C)     Temp Source 10/11/22 1602 Oral     SpO2 10/11/22 1602 96 %     Weight 10/11/22 1600 150 lb (68 kg)     Height 10/11/22 1600 6' (1.829 m)     Head Circumference --      Peak Flow --      Pain Score 10/11/22 1600 0     Pain Loc --      Pain Edu? --      Excl. in [place]? --     Most recent vital signs: Vitals:   10/11/22 1602  BP: (!) 111/90  Pulse: (!) 108  Resp: 20  Temp: 98.5 F (36.9 C)  SpO2: 96%    General: Awake, no distress.  CV:  Good peripheral perfusion.  Regular rate and rhythm  Resp:  Normal effort.  Equal breath sounds bilaterally.  Abd:  No distention.  Soft, nontender.  No rebound or guarding. Other:  Equal grip strength bilaterally.  5/5 strength.  No pronator drift.  No cranial nerve deficits.   ED Results / Procedures / Treatments    MEDICATIONS ORDERED IN ED: Medications - No data to display   IMPRESSION / MDM / [place] / ED COURSE  I reviewed the triage vital signs and the nursing notes.  Patient's presentation is most consistent with acute presentation with potential threat to life or bodily function.  Patient resents emergency department for intermittent tingling numbness in his hands last night, states it has pretty much resolved today.  No headache.  Normal neurologic testing.  CBC is normal, chemistry shows mild hyperglycemia.  Patient states he has not eaten or drinking since this morning.  Discussed with the patient need to follow-up with a PCP we will place a referral for the patient.  Patient has a great neurologic exam and the patient's description is more consistent with peripheral nerve involvement or neuropathy.  Patient reassured by the workup and exam.  Will discharge with PCP follow-up.  Discussed with the patient watching his carbohydrate and sugar intake given his mild hyperglycemia.  FINAL CLINICAL IMPRESSION(S) / ED DIAGNOSES   Paresthesias Note:  This document was prepared using Dragon voice recognition software and may include unintentional dictation errors.   Harvest Dark, MD 10/11/22 2009

## 2022-10-11 NOTE — ED Triage Notes (Addendum)
Pt via POV from home. Pt c/o bilateral hand numbness for the past couple of days, and states that he has increased tiredness and has been "sleeping too long." Denies any medical hx or surgical hx. Pt is A&Ox4 and NAD

## 2022-11-21 ENCOUNTER — Emergency Department
Admission: EM | Admit: 2022-11-21 | Discharge: 2022-11-21 | Disposition: A | Discharge status: Home / Self Care | Payer: BC Managed Care – PPO | Attending: Emergency Medicine | Admitting: Emergency Medicine

## 2022-11-21 ENCOUNTER — Emergency Department: Payer: BC Managed Care – PPO

## 2022-11-21 DIAGNOSIS — M79642 Pain in left hand: Secondary | ICD-10-CM | POA: Insufficient documentation

## 2022-11-21 DIAGNOSIS — R200 Anesthesia of skin: Secondary | ICD-10-CM | POA: Diagnosis not present

## 2022-11-21 NOTE — ED Provider Notes (Signed)
[facility] Provider Note    Event Date/Time   First MD Initiated Contact with Patient 11/21/22 2132     (approximate)   History   Chief Complaint Hand Problem   HPI  Jonathon Perez is a 42 y.o. male with no significant past medical history who presents to the ED complaining of hand pain.  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  He states the hand was painful for him to move and he dropped multiple items he was working on.  He states that the pain has persisted today, primarily affects his middle and ring fingers.  He has not noticed any redness or swelling in the hand, denies any wounds.  He has not taken any pain medicine prior to arrival.     Physical Exam   Triage Vital Signs: ED Triage Vitals  Enc Vitals Group     BP 11/21/22 1916 (!) 140/76     Pulse Rate 11/21/22 1916 86     Resp 11/21/22 1916 18     Temp 11/21/22 1916 98.2 F (36.8 C)     Temp Source 11/21/22 1916 Oral     SpO2 11/21/22 1916 99 %     Weight 11/21/22 1917 155 lb (70.3 kg)     Height 11/21/22 1917 6' (1.829 m)     Head Circumference --      Peak Flow --      Pain Score 11/21/22 1916 7     Pain Loc --      Pain Edu? --      Excl. in GC? --     Most recent vital signs: Vitals:   11/21/22 1916  BP: (!) 140/76  Pulse: 86  Resp: 18  Temp: 98.2 F (36.8 C)  SpO2: 99%    Constitutional: Alert and oriented. Eyes: Conjunctivae are normal. Head: Atraumatic. Nose: No congestion/rhinnorhea. Mouth/Throat: Mucous membranes are moist.  Cardiovascular: Normal rate, regular rhythm. Grossly normal heart sounds.  2+ radial pulses bilaterally, cap refill less than 2 seconds in digits of left hand. Respiratory: Normal respiratory effort.  No retractions. Lungs CTAB. Gastrointestinal: Soft and nontender. No distention. Musculoskeletal: No lower  extremity tenderness nor edema.  No edema, erythema, or warmth at left hand, range of motion intact throughout left hand with mild discomfort in fingers.  Sensation intact throughout left hand. Neurologic:  Normal speech and language. No gross focal neurologic deficits are appreciated.    ED Results / Procedures / Treatments   Labs (all labs ordered are listed, but only abnormal results are displayed) Labs Reviewed - No data to display  RADIOLOGY Left hand x-ray reviewed and interpreted by me with no fracture or dislocation.  PROCEDURES:  Critical Care performed: No  Procedures   MEDICATIONS ORDERED IN ED: Medications - No data to display   IMPRESSION / MDM / ASSESSMENT AND PLAN / ED COURSE  I reviewed the triage vital signs and the nursing notes.                              42 y.o. 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  Patient's presentation is  most consistent with acute complicated illness / injury requiring diagnostic workup.  Differential diagnosis includes, but is not limited to, carpal tunnel, arthritis, cellulitis, septic arthritis, paresthesia.  Patient well-appearing and in no acute distress, vital signs are unremarkable.  He is neurovascularly intact to his left hand with no signs of infection on exam and cap refill less than 2 seconds.  Hand x-ray is unremarkable, distribution of symptoms does not seem consistent with carpal tunnel.  He primarily complains of pain in the joints with movement, may have a degree of arthritis.  He was counseled on rest, ice, and NSAIDs.  He was counseled to return to the ED for new or worsening symptoms.  Patient agrees with plan.      FINAL CLINICAL IMPRESSION(S) / ED DIAGNOSES   Final diagnoses:  Pain of left hand     Rx / DC Orders   ED Discharge Orders     None         Note:  This document was prepared using Dragon voice recognition software and may include unintentional dictation errors.   Chesley Noon, MD 11/21/22 2205

## 2022-11-21 NOTE — ED Triage Notes (Signed)
Ambulatory to triage with c/o left hand numbness since last night. Reports was picking up items at work last night and lost strength. Having pain in left middle finger and index finger.  +CMS to affected extremity.

## 2022-12-17 ENCOUNTER — Emergency Department
Admission: EM | Admit: 2022-12-17 | Discharge: 2022-12-17 | Disposition: A | Discharge status: Home / Self Care | Payer: BC Managed Care – PPO | Attending: Emergency Medicine | Admitting: Emergency Medicine

## 2022-12-17 ENCOUNTER — Other Ambulatory Visit: Payer: Self-pay

## 2022-12-17 ENCOUNTER — Encounter: Payer: Self-pay | Admitting: Emergency Medicine

## 2022-12-17 DIAGNOSIS — Z20822 Contact with and (suspected) exposure to covid-19: Secondary | ICD-10-CM | POA: Insufficient documentation

## 2022-12-17 DIAGNOSIS — J020 Streptococcal pharyngitis: Secondary | ICD-10-CM | POA: Insufficient documentation

## 2022-12-17 DIAGNOSIS — J029 Acute pharyngitis, unspecified: Secondary | ICD-10-CM | POA: Diagnosis not present

## 2022-12-17 LAB — GROUP A STREP BY PCR: Group A Strep by PCR: DETECTED — AB

## 2022-12-17 LAB — SARS CORONAVIRUS 2 BY RT PCR: SARS Coronavirus 2 by RT PCR: NEGATIVE

## 2022-12-17 MED ORDER — KETOROLAC TROMETHAMINE 15 MG/ML IJ SOLN
15.0000 mg | Freq: Once | INTRAMUSCULAR | Status: AC
  Administered 2022-12-17: 15 mg via INTRAMUSCULAR
  Filled 2022-12-17: qty 1

## 2022-12-17 MED ORDER — AMOXICILLIN-POT CLAVULANATE 875-125 MG PO TABS: 1.0000 | ORAL_TABLET | Freq: Two times a day (BID) | ORAL | 0 refills | Status: AC

## 2022-12-17 NOTE — ED Triage Notes (Signed)
Pt to ED via POV for sore throat. Pt states that he is having a hard time eating due to the pain. Pt denies fever and chills. Pt states that his pain started 3 days ago.

## 2022-12-17 NOTE — ED Notes (Signed)
Patient declined discharge vital signs. 

## 2022-12-17 NOTE — ED Provider Notes (Signed)
[facility] Provider Note    Event Date/Time   First MD Initiated Contact with Patient 12/17/22 0920     (approximate)   History   Sore Throat   HPI  Jonathon Perez is a 42 y.o. male who presents today for evaluation of sore throat for the past 3 days.  Patient reports that it hurts when he swallows, though he is able to swallow.  No fevers.  No neck pain or stiffness.  No other symptoms like cough or runny nose.  No change in his voice.  There are no problems to display for this patient.         Physical Exam   Triage Vital Signs: ED Triage Vitals  Enc Vitals Group     BP 12/17/22 0917 (!) 135/102     Pulse Rate 12/17/22 0917 (!) 101     Resp 12/17/22 0917 16     Temp 12/17/22 0917 98.9 F (37.2 C)     Temp Source 12/17/22 0917 Oral     SpO2 12/17/22 0917 97 %     Weight 12/17/22 0916 155 lb (70.3 kg)     Height 12/17/22 0916 6' (1.829 m)     Head Circumference --      Peak Flow --      Pain Score 12/17/22 0916 10     Pain Loc --      Pain Edu? --      Excl. in [place]? --     Most recent vital signs: Vitals:   12/17/22 0917  BP: (!) 135/102  Pulse: (!) 101  Resp: 16  Temp: 98.9 F (37.2 C)  SpO2: 97%    Physical Exam Vitals and nursing note reviewed.  Constitutional:      General: Awake and alert. No acute distress.    Appearance: Normal appearance. The patient is normal weight.  HENT:     Head: Normocephalic and atraumatic.     Mouth: Mucous membranes are moist. Uvula midline.  Mild oropharyngeal erythema.  No tonsillar exudate.  No soft palate fluctuance.  No trismus.  No voice change.  No sublingual swelling.  No tender cervical lymphadenopathy.  No nuchal rigidity Eyes:     General: PERRL. Normal EOMs        Right eye: No discharge.        Left eye: No discharge.     Conjunctiva/sclera: Conjunctivae normal.  Cardiovascular:     Rate and Rhythm: Normal rate and regular rhythm.     Pulses: Normal pulses.  Pulmonary:      Effort: Pulmonary effort is normal. No respiratory distress.     Breath sounds: Normal breath sounds.  Abdominal:     Abdomen is soft. There is no abdominal tenderness. No rebound or guarding. No distention. Musculoskeletal:        General: No swelling. Normal range of motion.     Cervical back: Normal range of motion and neck supple.  Skin:    General: Skin is warm and dry.     Capillary Refill: Capillary refill takes less than 2 seconds.     Findings: No rash.  Neurological:     Mental Status: The patient is awake and alert.      ED Results / Procedures / Treatments   Labs (all labs ordered are listed, but only abnormal results are displayed) Labs Reviewed  GROUP A STREP BY PCR - Abnormal; Notable for the following components:      Result  Value   Group A Strep by PCR DETECTED (*)    All other components within normal limits  SARS CORONAVIRUS 2 BY RT PCR     EKG     RADIOLOGY     PROCEDURES:  Critical Care performed:   Procedures   MEDICATIONS ORDERED IN ED: Medications  ketorolac (TORADOL) 15 MG/ML injection 15 mg (15 mg Intramuscular Given 12/17/22 0934)     IMPRESSION / MDM / ASSESSMENT AND PLAN / ED COURSE  I reviewed the triage vital signs and the nursing notes.   Differential diagnosis includes, but is not limited to, strep pharyngitis, viral pharyngitis, COVID-19, other URI.  Patient is awake and alert, hemodynamically stable and afebrile.  He is nontoxic in appearance.  His uvula is midline, no soft palate fluctuance, no drooling, no trismus, no voice change, no neck pain, not consistent with peritonsillar or retropharyngeal abscess.  Swabs obtained.  Patient was treated symptomatically with Toradol IM.  Swab is positive for strep pharyngitis.  We discussed the importance of initiating antibiotic therapy in order to prevent poststreptococcal complications.  Patient was advised to complete the entire course of antibiotics even if he begins to  feel better.  We also discussed that he is contagious to others.  He was given a work note.  We discussed return precautions and outpatient follow-up.  Patient understands and agrees with plan.  He was discharged in stable condition.   Patient's presentation is most consistent with acute complicated illness / injury requiring diagnostic workup.    FINAL CLINICAL IMPRESSION(S) / ED DIAGNOSES   Final diagnoses:  Strep throat     Rx / DC Orders   ED Discharge Orders          Ordered    amoxicillin-clavulanate (AUGMENTIN) 875-125 MG tablet  2 times daily        12/17/22 1037             Note:  This document was prepared using Dragon voice recognition software and may include unintentional dictation errors.   Jackelyn Hoehn, PA-C 12/17/22 1453    Concha Se, MD 12/22/22 1154

## 2022-12-17 NOTE — Discharge Instructions (Signed)
Your strep test was positive.  Please take antibiotics for the full course of treatment even if you begin to feel better.  Remember that you are contagious to others.  Please return for any new, worsening, or change in symptoms or other concerns.  It was a pleasure caring for you today.

## 2022-12-23 ENCOUNTER — Other Ambulatory Visit: Payer: Self-pay

## 2022-12-23 ENCOUNTER — Ambulatory Visit (INDEPENDENT_AMBULATORY_CARE_PROVIDER_SITE_OTHER): Payer: BC Managed Care – PPO | Admitting: Nurse Practitioner

## 2022-12-23 ENCOUNTER — Encounter: Payer: Self-pay | Admitting: Nurse Practitioner

## 2022-12-23 VITALS — BP 118/78 | HR 90 | Temp 98.1°F | Resp 18 | Ht 72.0 in | Wt 148.4 lb

## 2022-12-23 DIAGNOSIS — R739 Hyperglycemia, unspecified: Secondary | ICD-10-CM

## 2022-12-23 DIAGNOSIS — Z716 Tobacco abuse counseling: Secondary | ICD-10-CM

## 2022-12-23 DIAGNOSIS — Z7689 Persons encountering health services in other specified circumstances: Secondary | ICD-10-CM | POA: Diagnosis not present

## 2022-12-23 DIAGNOSIS — M79642 Pain in left hand: Secondary | ICD-10-CM | POA: Diagnosis not present

## 2022-12-23 DIAGNOSIS — Z1159 Encounter for screening for other viral diseases: Secondary | ICD-10-CM | POA: Diagnosis not present

## 2022-12-23 DIAGNOSIS — Z131 Encounter for screening for diabetes mellitus: Secondary | ICD-10-CM | POA: Diagnosis not present

## 2022-12-23 DIAGNOSIS — R634 Abnormal weight loss: Secondary | ICD-10-CM

## 2022-12-23 DIAGNOSIS — Z13220 Encounter for screening for lipoid disorders: Secondary | ICD-10-CM | POA: Diagnosis not present

## 2022-12-23 DIAGNOSIS — F331 Major depressive disorder, recurrent, moderate: Secondary | ICD-10-CM | POA: Insufficient documentation

## 2022-12-23 DIAGNOSIS — Z114 Encounter for screening for human immunodeficiency virus [HIV]: Secondary | ICD-10-CM

## 2022-12-23 DIAGNOSIS — Z130 Encounter for screening for diseases of the blood and blood-forming organs and certain disorders involving the immune mechanism: Secondary | ICD-10-CM

## 2022-12-23 DIAGNOSIS — Z800 Family history of malignant neoplasm of digestive organs: Secondary | ICD-10-CM

## 2022-12-23 MED ORDER — VARENICLINE TARTRATE (STARTER) 0.5 MG X 11 & 1 MG X 42 PO TBPK
ORAL_TABLET | ORAL | Status: DC
Start: 2022-12-23 — End: 2022-12-23

## 2022-12-23 MED ORDER — VARENICLINE TARTRATE (STARTER) 0.5 MG X 11 & 1 MG X 42 PO TBPK
ORAL_TABLET | ORAL | Status: AC
Start: 2022-12-23 — End: ?

## 2022-12-23 MED ORDER — ESCITALOPRAM OXALATE 10 MG PO TABS
10.0000 mg | ORAL_TABLET | Freq: Every day | ORAL | 0 refills | Status: DC
Start: 2022-12-23 — End: 2023-02-07

## 2022-12-23 NOTE — Progress Notes (Signed)
BP 118/78   Pulse 90   Temp 98.1 F (36.7 C) (Oral)   Resp 18   Ht 6' (1.829 m)   Wt 148 lb 6.4 oz (67.3 kg)   SpO2 98%   BMI 20.13 kg/m    Subjective:    Patient ID: Jonathon Perez, male    DOB: January 16, 1981, 42 y.o.   MRN: [id number]  HPI: Jonathon Perez is a 42 y.o. male  Chief Complaint  Patient presents with   Establish Care   Sleeping Problem   Weight Loss    Last weight was 160 lb   Hand Pain    Left hand 2 middle fingers   Establish care: his last physical was long time ago.  Medical history includes borderline diabetic.  Family history includes dm, colon cancer.  Health Maintenance labs.   Weight loss/loss of appetite: he reports that he used to weight 160 lbs not to long ago.  Currently 148 lbs without trying to lose weight.  He also reports loss of appetite. Will get labs and treat depression. He is also a smoker so will get a chest xray.    He also has a family history of colon cancer ( mom). Will place referral to gi.    Hand pain:recently seen in er for pain. had xray of left hand which was negative.  Was instructed to use NSAIDs, rest and ice for pain.  Patient reports he has not taken anything for the pain. Pain is located in his middle and ring finger of left hand.  He says the pain constant.  He reports this has been going on for a few months.  He denies any known injury but reports he does work with his hands at an [employer]. Trial Voltaren gel.   Depression: He reports that he has been struggling with mental health for the last 4 years.  He has never been on medication.   He denies any suicidal thoughts.  He says that he is thinking constantly.  He reports that sometimes he just wants to be left alone.   Medication:  Compliant n/a Side effects n/a PHQ9 positive Therapy n/a     12/23/2022    1:11 PM  Depression screen PHQ 2/9  Decreased Interest 3  Down, Depressed, Hopeless 1  PHQ - 2 Score 4  Altered sleeping 3  Tired, decreased energy 3   Change in appetite 3  Feeling bad or failure about yourself  3  Trouble concentrating 1  Moving slowly or fidgety/restless 1  Suicidal thoughts 0  PHQ-9 Score 18  Difficult doing work/chores Somewhat difficult    Elevated blood sugar: patient was seen in er on 10/29/2022. Had CMP done which showed his blood sugar was 160.  Patient denies any polyuria, polydipsia or polyphagia. Will get labs today.   Smoking cessation instruction/counseling given:  counseled patient on the dangers of tobacco use, advised patient to stop smoking, and reviewed strategies to maximize success  Patient previously tried chantix and said it worked for him until he ran out. Will restart.   Relevant past medical, surgical, family and social history reviewed and updated as indicated. Interim medical history since our last visit reviewed. Allergies and medications reviewed and updated.  Review of Systems  Constitutional: Negative for fever, positive  weight change.  Respiratory: Negative for cough and shortness of breath.   Cardiovascular: Negative for chest pain or palpitations.  Gastrointestinal: Negative for abdominal pain, no bowel changes.  Musculoskeletal: Negative for  gait problem or joint swelling.  Skin: Negative for rash.  Neurological: Negative for dizziness or headache.  No other specific complaints in a complete review of systems (except as listed in HPI above).      Objective:    BP 118/78   Pulse 90   Temp 98.1 F (36.7 C) (Oral)   Resp 18   Ht 6' (1.829 m)   Wt 148 lb 6.4 oz (67.3 kg)   SpO2 98%   BMI 20.13 kg/m   Wt Readings from Last 3 Encounters:  12/23/22 148 lb 6.4 oz (67.3 kg)  12/17/22 155 lb (70.3 kg)  11/21/22 155 lb (70.3 kg)    Physical Exam  Constitutional: Patient appears well-developed and well-nourished.  No distress.  HEENT: head atraumatic, normocephalic, pupils equal and reactive to light, neck supple Cardiovascular: Normal rate, regular rhythm and normal heart  sounds.  No murmur heard. No BLE edema. Pulmonary/Chest: Effort normal and breath sounds normal. No respiratory distress. Abdominal: Soft.  There is no tenderness. Psychiatric: Patient has a normal mood and affect. behavior is normal. Judgment and thought content normal.      Assessment & Plan:   Problem List Items Addressed This Visit       Other   Moderate episode of recurrent major depressive disorder (HCC) - Primary    Lexapro 10 mg daily.  Follow-up in 4 weeks.      Relevant Medications   escitalopram (LEXAPRO) 10 MG tablet   Other Visit Diagnoses     Encounter to establish care       Schedule CPE   Screening for diabetes mellitus       Relevant Orders   COMPLETE METABOLIC PANEL WITH GFR   Hemoglobin A1c   Screening for deficiency anemia       Relevant Orders   CBC with Differential/Platelet   Screening for cholesterol level       Relevant Orders   Lipid panel   Screening for HIV without presence of risk factors       Relevant Orders   HIV Antibody (routine testing w rflx)   Encounter for hepatitis C screening test for low risk patient       Relevant Orders   Hepatitis C antibody   Elevated blood sugar       Getting labs   Pain of left hand       Using Voltaren gel.  Checking labs, if continued pain need to send to Ortho   Relevant Orders   Rheumatoid Factor   Weight loss       Family history of colorectal cancer from placed to GI.  Chest x-ray and labs ordered.  Treatment for depression   Relevant Orders   HIV Antibody (routine testing w rflx)   TSH   RPR   DG Chest 2 View   Ambulatory referral to Gastroenterology   Encounter for smoking cessation counseling       Start Chantix   Relevant Medications   Varenicline Tartrate, Starter, (CHANTIX STARTING MONTH PAK) 0.5 MG X 11 & 1 MG X 42 TBPK   Family history of colon cancer       Referral placed to GI for colonoscopy        Follow up plan: Return in about 4 weeks (around 01/20/2023) for follow  up.

## 2022-12-23 NOTE — Addendum Note (Signed)
Addended by: Della Goo F on: 12/23/2022 02:18 PM   Modules accepted: Orders

## 2022-12-23 NOTE — Assessment & Plan Note (Signed)
Lexapro 10 mg daily.  Follow-up in 4 weeks.

## 2022-12-24 LAB — LIPID PANEL: HDL: 43 mg/dL (ref >=40)

## 2022-12-24 LAB — COMPLETE METABOLIC PANEL WITH GFR
Calcium: 9.6 mg/dL (ref 8.6–10.3)
Chloride: 105 mmol/L (ref 98–110)

## 2022-12-24 LAB — HEMOGLOBIN A1C: Hgb A1c MFr Bld: 6.3 % of total Hgb — ABNORMAL HIGH (ref <5.7)

## 2022-12-25 ENCOUNTER — Other Ambulatory Visit: Payer: Self-pay | Admitting: Nurse Practitioner

## 2022-12-25 DIAGNOSIS — D649 Anemia, unspecified: Secondary | ICD-10-CM

## 2022-12-26 LAB — CBC WITH DIFFERENTIAL/PLATELET: Monocytes Relative: 8.6 %

## 2022-12-27 LAB — COMPLETE METABOLIC PANEL WITH GFR
AG Ratio: 1.1 (calc) (ref 1.0–2.5)
ALT: 14 U/L (ref 9–46)
AST: 15 U/L (ref 10–40)
Albumin: 3.9 g/dL (ref 3.6–5.1)
Alkaline phosphatase (APISO): 65 U/L (ref 36–130)
BUN: 13 mg/dL (ref 7–25)
CO2: 28 mmol/L (ref 20–32)
Creat: 1.05 mg/dL (ref 0.60–1.29)
Globulin: 3.6 g/dL (calc) (ref 1.9–3.7)
Glucose, Bld: 84 mg/dL (ref 65–99)
Potassium: 4.8 mmol/L (ref 3.5–5.3)
Sodium: 141 mmol/L (ref 135–146)
Total Bilirubin: 0.2 mg/dL (ref 0.2–1.2)
Total Protein: 7.5 g/dL (ref 6.1–8.1)
eGFR: 90 mL/min/{1.73_m2} (ref >=60)

## 2022-12-27 LAB — TEST AUTHORIZATION

## 2022-12-27 LAB — RHEUMATOID FACTOR: Rheumatoid fact SerPl-aCnc: <10 IU/mL (ref <14)

## 2022-12-27 LAB — CBC WITH DIFFERENTIAL/PLATELET
Absolute Monocytes: 671 cells/uL (ref 200–950)
Basophils Absolute: 78 cells/uL (ref 0–200)
Basophils Relative: 1 %
Eosinophils Absolute: 320 cells/uL (ref 15–500)
Eosinophils Relative: 4.1 %
HCT: 37.3 % — ABNORMAL LOW (ref 38.5–50.0)
Hemoglobin: 12.2 g/dL — ABNORMAL LOW (ref 13.2–17.1)
Lymphs Abs: 2769 cells/uL (ref 850–3900)
MCH: 28.7 pg (ref 27.0–33.0)
MCHC: 32.7 g/dL (ref 32.0–36.0)
MCV: 87.8 fL (ref 80.0–100.0)
MPV: 9.1 fL (ref 7.5–12.5)
Neutro Abs: 3962 cells/uL (ref 1500–7800)
Neutrophils Relative %: 50.8 %
Platelets: 452 10*3/uL — ABNORMAL HIGH (ref 140–400)
RBC: 4.25 10*6/uL (ref 4.20–5.80)
RDW: 12.9 % (ref 11.0–15.0)
Total Lymphocyte: 35.5 %
WBC: 7.8 10*3/uL (ref 3.8–10.8)

## 2022-12-27 LAB — LIPID PANEL
Cholesterol: 167 mg/dL (ref <200)
LDL Cholesterol (Calc): 106 mg/dL (calc) — ABNORMAL HIGH
Non-HDL Cholesterol (Calc): 124 mg/dL (calc) (ref <130)
Total CHOL/HDL Ratio: 3.9 (calc) (ref <5.0)
Triglycerides: 86 mg/dL (ref <150)

## 2022-12-27 LAB — IRON,TIBC AND FERRITIN PANEL
%SAT: 28 % (calc) (ref 20–48)
Ferritin: 166 ng/mL (ref 38–380)
Iron: 77 ug/dL (ref 50–180)
TIBC: 279 mcg/dL (calc) (ref 250–425)

## 2022-12-27 LAB — HEMOGLOBIN A1C
Mean Plasma Glucose: 134 mg/dL
eAG (mmol/L): 7.4 mmol/L

## 2022-12-27 LAB — RPR: RPR Ser Ql: NONREACTIVE

## 2022-12-27 LAB — HEPATITIS C ANTIBODY: Hepatitis C Ab: NONREACTIVE

## 2022-12-27 LAB — HIV ANTIBODY (ROUTINE TESTING W REFLEX): HIV 1&2 Ab, 4th Generation: NONREACTIVE

## 2022-12-27 LAB — TSH: TSH: 2.54 mIU/L (ref 0.40–4.50)

## 2023-01-03 ENCOUNTER — Ambulatory Visit
Admission: RE | Admit: 2023-01-03 | Discharge: 2023-01-03 | Disposition: A | Discharge status: Home / Self Care | Payer: BC Managed Care – PPO | Source: Ambulatory Visit | Attending: Nurse Practitioner | Admitting: Nurse Practitioner

## 2023-01-03 ENCOUNTER — Ambulatory Visit
Admission: RE | Admit: 2023-01-03 | Discharge: 2023-01-03 | Disposition: A | Discharge status: Home / Self Care | Payer: BC Managed Care – PPO | Attending: Nurse Practitioner | Admitting: Nurse Practitioner

## 2023-01-03 DIAGNOSIS — R634 Abnormal weight loss: Secondary | ICD-10-CM

## 2023-01-19 NOTE — Progress Notes (Deleted)
   There were no vitals taken for this visit.   Subjective:    Patient ID: Jonathon Perez, male    DOB: [birth year], 42 y.o.   MRN: [id number]  HPI: Jonathon Perez is a 42 y.o. male  No chief complaint on file.  Depression/anxiety Medication lexapro 10 mg daily, patient here for 4 week follow up. Patient reports *** Compliant *** Side effects *** PHQ9 *** GAD *** Therapy ***      12/23/2022    1:11 PM  Depression screen PHQ 2/9  Decreased Interest 3  Down, Depressed, Hopeless 1  PHQ - 2 Score 4  Altered sleeping 3  Tired, decreased energy 3  Change in appetite 3  Feeling bad or failure about yourself  3  Trouble concentrating 1  Moving slowly or fidgety/restless 1  Suicidal thoughts 0  PHQ-9 Score 18  Difficult doing work/chores Somewhat difficult    Relevant past medical, surgical, family and social history reviewed and updated as indicated. Interim medical history since our last visit reviewed. Allergies and medications reviewed and updated.  Review of Systems  Constitutional: Negative for fever, positive  weight change.  Respiratory: Negative for cough and shortness of breath.   Cardiovascular: Negative for chest pain or palpitations.  Gastrointestinal: Negative for abdominal pain, no bowel changes.  Musculoskeletal: Negative for gait problem or joint swelling.  Skin: Negative for rash.  Neurological: Negative for dizziness or headache.  No other specific complaints in a complete review of systems (except as listed in HPI above).      Objective:    There were no vitals taken for this visit.  Wt Readings from Last 3 Encounters:  12/23/22 148 lb 6.4 oz (67.3 kg)  12/17/22 155 lb (70.3 kg)  11/21/22 155 lb (70.3 kg)    Physical Exam  Constitutional: Patient appears well-developed and well-nourished.  No distress.  HEENT: head atraumatic, normocephalic, pupils equal and reactive to light, neck supple Cardiovascular: Normal rate, regular rhythm and  normal heart sounds.  No murmur heard. No BLE edema. Pulmonary/Chest: Effort normal and breath sounds normal. No respiratory distress. Abdominal: Soft.  There is no tenderness. Psychiatric: Patient has a normal mood and affect. behavior is normal. Judgment and thought content normal.      Assessment & Plan:   Problem List Items Addressed This Visit   None     Follow up plan: No follow-ups on file.

## 2023-01-20 ENCOUNTER — Ambulatory Visit: Payer: BC Managed Care – PPO | Admitting: Nurse Practitioner

## 2023-02-05 ENCOUNTER — Other Ambulatory Visit: Payer: Self-pay | Admitting: Nurse Practitioner

## 2023-02-05 DIAGNOSIS — F331 Major depressive disorder, recurrent, moderate: Secondary | ICD-10-CM

## 2023-02-06 NOTE — Progress Notes (Deleted)
Jonathon Amy, PA-C [address]  [address]  [place], [place] [address]  Main: [phone]  Fax: [phone]   Gastroenterology Consultation  Referring Provider:     Berniece Salines, FNP Primary Care Physician:  Jonathon Salines, FNP Primary Gastroenterologist:  *** Reason for Consultation:     Weight loss        HPI:   Jonathon Perez is a 42 y.o. y/o male referred for consultation & management  by Jonathon Salines, FNP.    Patient states he has lost about 12 pounds unintentional in the past few months.  Not trying to lose weight.  He has loss of appetite.  Is a smoker and had recent normal chest x-ray.  As family history of colon cancer in his mother.  He has never had a colonoscopy.  He denies any GI symptoms.  Recently started on medication to treat depression.  Labs 12/23/2022 showed mild anemia with hemoglobin 12.2, hematocrit 37, MCV 87.  Normal total iron 77, iron saturation 28%, ferritin 166.  Normal TSH.  Normal CMP.  Negative HIV and hepatitis C.  Negative COVID test.  A1c 6.3.  No recent B12 level.  Labs 3 months ago showed baseline hemoglobin 15.5.    No past medical history on file.  No past surgical history on file.  Prior to Admission medications   Medication Sig Start Date End Date Taking? Authorizing Provider  cetirizine (ZYRTEC ALLERGY) 10 MG tablet Take 1 tablet (10 mg total) by mouth 2 (two) times daily. 01/11/22 02/10/22  Georga Hacking, MD  escitalopram (LEXAPRO) 10 MG tablet Take 1 tablet (10 mg total) by mouth daily. 12/23/22   Jonathon Salines, FNP  famotidine (PEPCID) 20 MG tablet Take 1 tablet (20 mg total) by mouth 2 (two) times daily. 01/11/22 02/10/22  Georga Hacking, MD  Varenicline Tartrate, Starter, (CHANTIX STARTING MONTH PAK) 0.5 MG X 11 & 1 MG X 42 TBPK Take 0.5 mg tablet by mouth 1x daily for 3 days, then increase to 0.5 mg tablet 2x daily for 4 days, then increase to 1 mg tablet 2x daily. 12/23/22   Jonathon Salines, FNP    Family  History  Problem Relation Age of Onset   Cancer Mother    Diabetes Mother      Social History   Tobacco Use   Smoking status: Every Day    Current packs/day: 0.50    Types: Cigarettes   Smokeless tobacco: Never  Vaping Use   Vaping status: Never Used  Substance Use Topics   Alcohol use: Yes   Drug use: Yes    Types: Marijuana, Cocaine    Allergies as of 02/07/2023   (No Known Allergies)    Review of Systems:    All systems reviewed and negative except where noted in HPI.   Physical Exam:  There were no vitals taken for this visit. No LMP for male patient. Psych:  Alert and cooperative. Normal mood and affect. General:   Alert,  Well-developed, well-nourished, pleasant and cooperative in NAD Head:  Normocephalic and atraumatic. Eyes:  Sclera clear, no icterus.   Conjunctiva pink. Neck:  Supple; no masses or thyromegaly. Lungs:  Respirations even and unlabored.  Clear throughout to auscultation.   No wheezes, crackles, or rhonchi. No acute distress. Heart:  Regular rate and rhythm; no murmurs, clicks, rubs, or gallops. Abdomen:  Normal bowel sounds.  No bruits.  Soft, and non-distended without masses, hepatosplenomegaly or hernias noted.  No Tenderness.  No guarding or rebound tenderness.    Neurologic:  Alert and oriented x3;  grossly normal neurologically. Psych:  Alert and cooperative. Normal mood and affect.  Imaging Studies: No results found.  Assessment and Plan:   Jonathon Perez is a 42 y.o. y/o male has been referred for   1.  Unintentional weight loss  Abdominal pelvic CT if abdominal pain  Schedule EGD and colonoscopy  Scheduling EGD & Colonoscopy I discussed risks of EGD and colonoscopy with patient to include risk of bleeding, perforation, and risk of sedation.  Patient expressed understanding and agrees to proceed with procedures.    2.  Anemia; recent normal iron studies  Lab CBC, vitamin B12, folate  3.  GERD  Continue Pepcid and lifestyle  modification  4.  Family history of colon cancer - mother  Scheduling colonoscopy  Follow up in 3 months.  Jonathon Amy, PA-C    BP check ***

## 2023-02-07 ENCOUNTER — Ambulatory Visit: Payer: Self-pay | Admitting: Physician Assistant

## 2023-02-07 DIAGNOSIS — D649 Anemia, unspecified: Secondary | ICD-10-CM

## 2023-02-07 DIAGNOSIS — K219 Gastro-esophageal reflux disease without esophagitis: Secondary | ICD-10-CM

## 2023-02-07 DIAGNOSIS — R634 Abnormal weight loss: Secondary | ICD-10-CM

## 2023-02-07 DIAGNOSIS — Z800 Family history of malignant neoplasm of digestive organs: Secondary | ICD-10-CM

## 2023-02-07 NOTE — Telephone Encounter (Signed)
Requested Prescriptions  Pending Prescriptions Disp Refills   escitalopram (LEXAPRO) 10 MG tablet [Pharmacy Med Name: ESCITALOPRAM 10MG  TABLETS] 90 tablet 0    Sig: TAKE 1 TABLET(10 MG) BY MOUTH DAILY     Psychiatry:  Antidepressants - SSRI Passed - 02/05/2023  2:06 PM      Passed - Completed PHQ-2 or PHQ-9 in the last 360 days      Passed - Valid encounter within last 6 months    Recent Outpatient Visits           1 month ago Moderate episode of recurrent major depressive disorder [facility])   [facility] Berniece Salines, [place]

## 2023-03-04 ENCOUNTER — Other Ambulatory Visit: Payer: Self-pay

## 2023-03-04 DIAGNOSIS — L03317 Cellulitis of buttock: Secondary | ICD-10-CM | POA: Insufficient documentation

## 2023-03-04 DIAGNOSIS — L0231 Cutaneous abscess of buttock: Secondary | ICD-10-CM | POA: Insufficient documentation

## 2023-03-04 NOTE — ED Triage Notes (Signed)
Pt arrived via POV with reports of abscess near rectum that pt states is spreading up to groin. Pt did pop an area and states he pulled out a cord.  Painful to sit.

## 2023-03-05 ENCOUNTER — Emergency Department
Admission: EM | Admit: 2023-03-05 | Discharge: 2023-03-05 | Disposition: A | Discharge status: Home / Self Care | Payer: Self-pay | Attending: Emergency Medicine | Admitting: Emergency Medicine

## 2023-03-05 ENCOUNTER — Emergency Department: Payer: Self-pay

## 2023-03-05 DIAGNOSIS — L0291 Cutaneous abscess, unspecified: Secondary | ICD-10-CM

## 2023-03-05 DIAGNOSIS — L03317 Cellulitis of buttock: Secondary | ICD-10-CM

## 2023-03-05 LAB — BASIC METABOLIC PANEL
Anion gap: 10 (ref 5–15)
BUN: 17 mg/dL (ref 6–20)
CO2: 24 mmol/L (ref 22–32)
Calcium: 9.2 mg/dL (ref 8.9–10.3)
Chloride: 102 mmol/L (ref 98–111)
Creatinine, Ser: 1.08 mg/dL (ref 0.61–1.24)
GFR, Estimated: >60 mL/min (ref >60)
Glucose, Bld: 107 mg/dL — ABNORMAL HIGH (ref 70–99)
Potassium: 3.8 mmol/L (ref 3.5–5.1)
Sodium: 136 mmol/L (ref 135–145)

## 2023-03-05 LAB — LACTIC ACID, PLASMA: Lactic Acid, Venous: 0.6 mmol/L (ref 0.5–1.9)

## 2023-03-05 LAB — CBC WITH DIFFERENTIAL/PLATELET
Abs Immature Granulocytes: 0.03 10*3/uL (ref 0.00–0.07)
Basophils Absolute: 0.1 10*3/uL (ref 0.0–0.1)
Basophils Relative: 1 %
Eosinophils Absolute: 0.4 10*3/uL (ref 0.0–0.5)
Eosinophils Relative: 3 %
HCT: 43.5 % (ref 39.0–52.0)
Hemoglobin: 13.8 g/dL (ref 13.0–17.0)
Immature Granulocytes: 0 %
Lymphocytes Relative: 14 %
Lymphs Abs: 1.8 10*3/uL (ref 0.7–4.0)
MCH: 28.7 pg (ref 26.0–34.0)
MCHC: 31.7 g/dL (ref 30.0–36.0)
MCV: 90.4 fL (ref 80.0–100.0)
Monocytes Absolute: 1.2 10*3/uL — ABNORMAL HIGH (ref 0.1–1.0)
Monocytes Relative: 9 %
Neutro Abs: 9.6 10*3/uL — ABNORMAL HIGH (ref 1.7–7.7)
Neutrophils Relative %: 73 %
Platelets: 339 10*3/uL (ref 150–400)
RBC: 4.81 MIL/uL (ref 4.22–5.81)
RDW: 14.1 % (ref 11.5–15.5)
WBC: 13 10*3/uL — ABNORMAL HIGH (ref 4.0–10.5)
nRBC: 0 % (ref 0.0–0.2)

## 2023-03-05 MED ORDER — CLINDAMYCIN HCL 300 MG PO CAPS: 300.0000 mg | ORAL_CAPSULE | Freq: Three times a day (TID) | ORAL | 0 refills | Status: AC

## 2023-03-05 MED ORDER — SODIUM CHLORIDE 0.9 % IV BOLUS
1000.0000 mL | Freq: Once | INTRAVENOUS | Status: AC
  Administered 2023-03-05: 1000 mL via INTRAVENOUS

## 2023-03-05 MED ORDER — VANCOMYCIN HCL IN DEXTROSE 1-5 GM/200ML-% IV SOLN: 1000.0000 mg | Freq: Once | INTRAVENOUS | Status: DC

## 2023-03-05 MED ORDER — OXYCODONE-ACETAMINOPHEN 5-325 MG PO TABS: 1.0000 | ORAL_TABLET | ORAL | 0 refills | Status: AC | PRN

## 2023-03-05 MED ORDER — MORPHINE SULFATE (PF) 4 MG/ML IV SOLN
4.0000 mg | Freq: Once | INTRAVENOUS | Status: AC
  Administered 2023-03-05: 4 mg via INTRAVENOUS
  Filled 2023-03-05: qty 1

## 2023-03-05 MED ORDER — CLINDAMYCIN PHOSPHATE 900 MG/50ML IV SOLN
900.0000 mg | Freq: Once | INTRAVENOUS | Status: AC
  Administered 2023-03-05: 900 mg via INTRAVENOUS
  Filled 2023-03-05: qty 50

## 2023-03-05 MED ORDER — ONDANSETRON HCL 4 MG/2ML IJ SOLN
4.0000 mg | Freq: Once | INTRAMUSCULAR | Status: AC
  Administered 2023-03-05: 4 mg via INTRAVENOUS
  Filled 2023-03-05: qty 2

## 2023-03-05 MED ORDER — PIPERACILLIN-TAZOBACTAM 3.375 G IVPB 30 MIN
3.3750 g | Freq: Once | INTRAVENOUS | Status: AC
  Administered 2023-03-05: 3.375 g via INTRAVENOUS
  Filled 2023-03-05: qty 50

## 2023-03-05 MED ORDER — CEPHALEXIN 500 MG PO CAPS: 500.0000 mg | ORAL_CAPSULE | Freq: Three times a day (TID) | ORAL | 0 refills | Status: DC

## 2023-03-05 MED ORDER — IOHEXOL 300 MG/ML  SOLN
100.0000 mL | Freq: Once | INTRAMUSCULAR | Status: AC | PRN
  Administered 2023-03-05: 100 mL via INTRAVENOUS

## 2023-03-05 MED ORDER — VANCOMYCIN HCL 1500 MG/300ML IV SOLN
1500.0000 mg | Freq: Once | INTRAVENOUS | Status: AC
  Administered 2023-03-05: 1500 mg via INTRAVENOUS
  Filled 2023-03-05: qty 300

## 2023-03-05 NOTE — ED Provider Notes (Signed)
[facility] Provider Note    Event Date/Time   First MD Initiated Contact with Patient 03/05/23 [phone]     (approximate)   History   Abscess   HPI  Jonathon Perez is a 42 y.o. male who presents to the ED from home with a chief complaint of abscess near his rectum x 2 days.  Patient thinks he may have been bitten by an insect.  2 days ago his wife looked, tried to "pop it", and pulled out a quite cord with a pair of tweezers.  Since that time patient has noted firmness and swelling to his groin and left thigh.  Denies fever/chills, chest pain, shortness of breath, abdominal pain, nausea, vomiting, dysuria.  Denies testicular pain or swelling.     Past Medical History  No past medical history on file. No history of diabetes  Active Problem List   Patient Active Problem List   Diagnosis Date Noted   Moderate episode of recurrent major depressive disorder (HCC) 12/23/2022   Right patellofemoral syndrome 06/11/2016   Chronic pain of both ankles 03/01/2016   Vitamin D deficiency 01/28/2016   Chronic anemia 01/18/2016   Pain in right knee 01/18/2016   Tinea pedis of both feet 01/18/2016     Past Surgical History  No past surgical history on file.   Home Medications   Prior to Admission medications   Medication Sig Start Date End Date Taking? Authorizing Provider  cetirizine (ZYRTEC ALLERGY) 10 MG tablet Take 1 tablet (10 mg total) by mouth 2 (two) times daily. 01/11/22 02/10/22  Georga Hacking, MD  escitalopram (LEXAPRO) 10 MG tablet TAKE 1 TABLET(10 MG) BY MOUTH DAILY 02/07/23   Berniece Salines, FNP  famotidine (PEPCID) 20 MG tablet Take 1 tablet (20 mg total) by mouth 2 (two) times daily. 01/11/22 02/10/22  Georga Hacking, MD  Varenicline Tartrate, Starter, (CHANTIX STARTING MONTH PAK) 0.5 MG X 11 & 1 MG X 42 TBPK Take 0.5 mg tablet by mouth 1x daily for 3 days, then increase to 0.5 mg tablet 2x daily for 4 days, then increase to 1 mg tablet 2x  daily. 12/23/22   Berniece Salines, FNP     Allergies  Patient has no known allergies.   Family History   Family History  Problem Relation Age of Onset   Cancer Mother    Diabetes Mother      Physical Exam  Triage Vital Signs: ED Triage Vitals  Encounter Vitals Group     BP 03/04/23 2351 (!) 131/91     Systolic BP Percentile --      Diastolic BP Percentile --      Pulse Rate 03/04/23 2351 79     Resp --      Temp 03/04/23 2351 98.5 F (36.9 C)     Temp Source 03/04/23 2351 Oral     SpO2 03/04/23 2351 97 %     Weight 03/04/23 2350 145 lb (65.8 kg)     Height 03/04/23 2350 6' (1.829 m)     Head Circumference --      Peak Flow --      Pain Score 03/04/23 2351 10     Pain Loc --      Pain Education --      Exclude from Growth Chart --     Updated Vital Signs: BP 120/77   Pulse 75   Temp 98.5 F (36.9 C) (Oral)   Resp 18  Ht 6' (1.829 m)   Wt 65.8 kg   SpO2 100%   BMI 19.67 kg/m    General: Awake, no distress.  CV:  RRR.  Good peripheral perfusion.  Resp:  Normal effort.  CTAB. Abd:  Nontender to light or deep palpation.  No distention.  Other:  GU: Small draining abscess to left upper gluteal cleft.  Firmness without warmness, fluctuance or erythema noted to perineum into left upper thigh.  Testicles nontender, not swollen.   ED Results / Procedures / Treatments  Labs (all labs ordered are listed, but only abnormal results are displayed) Labs Reviewed  CBC WITH DIFFERENTIAL/PLATELET - Abnormal; Notable for the following components:      Result Value   WBC 13.0 (*)    Neutro Abs 9.6 (*)    Monocytes Absolute 1.2 (*)    All other components within normal limits  BASIC METABOLIC PANEL - Abnormal; Notable for the following components:   Glucose, Bld 107 (*)    All other components within normal limits  CULTURE, BLOOD (ROUTINE X 2)  CULTURE, BLOOD (ROUTINE X 2)  LACTIC ACID, PLASMA     EKG  None   RADIOLOGY I have independently visualized and  interpreted patient's CT as well as noted the radiology interpretation:  CT pelvis: Cellulitis, small hypodense collection abscess versus phlegmon  Official radiology report(s): CT PELVIS W CONTRAST  Result Date: 03/05/2023 CLINICAL DATA:  Gluteal cleft drainage suspicious for abscess. The patient believes he may have been bitten by a spider causing the infection. EXAM: CT PELVIS WITH CONTRAST TECHNIQUE: Multidetector CT imaging of the pelvis was performed using the standard protocol following the bolus administration of intravenous contrast. RADIATION DOSE REDUCTION: This exam was performed according to the departmental dose-optimization program which includes automated exposure control, adjustment of the mA and/or kV according to patient size and/or use of iterative reconstruction technique. CONTRAST:  OMNIPAQUE IOHEXOL 300 MG/ML  SOLN COMPARISON:  None Available. FINDINGS: Pelvic urinary Tract: The pelvic ureters are normal in caliber. No distal ureteral stone is seen. There is no bladder thickening. Pelvic bowel: No dilatation or wall thickening including the appendix. Vascular/Lymphatic: Single mildly prominent left inguinal chain node 1.1 cm in short axis and a few borderline bilaterally inguinal nodes. There are bilateral borderline prominent external iliac chain nodes. Also noted is pelvic venous congestion on the left-greater-than-right, with no other significant vascular findings. Reproductive: Prostatomegaly with transverse prostate axis 4.8 cm. Suggest clinical correlation. Other: Trace low-density pelvic free fluid in between the rectum and prostate. No free air, free hemorrhage, or pelvic abscess. There is thickening of the skin along the anteroinferior gluteal cleft with underlying soft tissue injection extending up into the perineum as well as posteriorly into the lower buttocks on the left-greater-than-right. Findings consistent with cellulitis. Underlying the perineal skin surface left  of the midline there is a rim enhancing hypodense collection measuring 1.4 x 1.2 x 1.6 cm and 40 Hounsfield units which could be a small viscous abscess or phlegmon, best seen on axial series 2 image 121 and coronal reconstruction image 47 of series 5. The inflammatory processes is centered below the level of the anorectal junction. There is no evidence of an anorectal fistula. There is no thickening in the adjacent scrotum. There is a small low-density right scrotal hydrocele. Both testicles are in the scrotal sac. There is no soft tissue gas or other discrete collections. Musculoskeletal: No suspicious bone lesions identified. IMPRESSION: 1. Skin thickening and underlying soft tissue injection  in the perineum and lower buttocks on the left-greater-than-right consistent with cellulitis. 2. Underlying the skin surface in the perineum left of the midline there is a 1.4 x 1.2 x 1.6 cm rim enhancing hypodense collection which could be a small viscous abscess or phlegmon. 3. No soft tissue gas or other discrete collections. Above findings may proceed the onset of Fournier's gangrene however, particularly in diabetics, and close clinical follow-up is recommended. 4. No evidence of anorectal fistula. 5. Pelvic venous congestion on the left-greater-than-right. 6. Small low-density right scrotal hydrocele. No scrotal skin thickening. 7. Prostatomegaly. 8. Trace low-density pelvic free fluid. 9. Borderline prominent inguinal and external iliac chain nodes. Single slightly prominent left inguinal chain node. Electronically Signed   By: Almira Bar M.D.   On: 03/05/2023 02:56     PROCEDURES:  Critical Care performed: No  .1-3 Lead EKG Interpretation  Performed by: Irean Hong, MD Authorized by: Irean Hong, MD     Interpretation: normal     ECG rate:  80   ECG rate assessment: normal     Rhythm: sinus rhythm     Ectopy: none     Conduction: normal   Comments:     Placed on cardiac monitor to evaluate for  arrhythmias    MEDICATIONS ORDERED IN ED: Medications  vancomycin (VANCOREADY) IVPB 1500 mg/300 mL (1,500 mg Intravenous New Bag/Given 03/05/23 0317)  sodium chloride 0.9 % bolus 1,000 mL (1,000 mLs Intravenous New Bag/Given 03/05/23 0116)  ondansetron (ZOFRAN) injection 4 mg (4 mg Intravenous Given 03/05/23 0114)  morphine (PF) 4 MG/ML injection 4 mg (4 mg Intravenous Given 03/05/23 0115)  piperacillin-tazobactam (ZOSYN) IVPB 3.375 g (0 g Intravenous Stopped 03/05/23 0327)  clindamycin (CLEOCIN) IVPB 900 mg (0 mg Intravenous Stopped 03/05/23 0231)  iohexol (OMNIPAQUE) 300 MG/ML solution 100 mL (100 mLs Intravenous Contrast Given 03/05/23 0219)     IMPRESSION / MDM / ASSESSMENT AND PLAN / ED COURSE  I reviewed the triage vital signs and the nursing notes.                             42 year old male presenting with gluteal cleft abscess and firmness to perineum.  Differential diagnosis includes but is not limited to cellulitis, abscess, Fournier's, etc.  I personally reviewed patient's records and note a PCP office visit on 12/23/2022 for maintenance medical care.  Patient's presentation is most consistent with acute presentation with potential threat to life or bodily function.  The patient is on the cardiac monitor to evaluate for evidence of arrhythmia and/or significant heart rate changes.  Will obtain sepsis protocol lab work, CT pelvis with IV contrast.  Patient appears  Will initiate IV fluid hydration, IV Morphine for pain and reassess.  Clinical Course as of 03/05/23 1308  Wynelle Link Mar 05, 2023  0335 Patient feeling feeling better.  Laboratory results demonstrate mild leukocytosis with WBC 13, lactic acid negative.  CT demonstrates cellulitis with possible small abscess versus phlegmon in the area that is currently draining.  Overall patient appears nontoxic.  Will discharge home on antibiotics, analgesia and urology follow-up.  Strict return precautions given.  Patient and spouse  verbalized understanding agree with plan of care. [JS]    Clinical Course User Index [JS] Irean Hong, MD     FINAL CLINICAL IMPRESSION(S) / ED DIAGNOSES   Final diagnoses:  Abscess  Cellulitis of buttock     Rx / DC Orders  ED Discharge Orders     None        Note:  This document was prepared using Dragon voice recognition software and may include unintentional dictation errors.   Irean Hong, MD 03/05/23 [phone]

## 2023-03-05 NOTE — Discharge Instructions (Signed)
Finish antibiotics as prescribed.  You may take ibuprofen as needed for pain, Percocet as needed for more severe pain.  Warm water soaks several times daily.  Return to the ER for worsening symptoms, persistent vomiting, difficulty breathing or other concerns.

## 2023-03-10 LAB — CULTURE, BLOOD (ROUTINE X 2)
Culture: NO GROWTH
Culture: NO GROWTH
Special Requests: ADEQUATE
Special Requests: ADEQUATE

## 2023-04-08 ENCOUNTER — Emergency Department
Admission: EM | Admit: 2023-04-08 | Discharge: 2023-04-08 | Disposition: A | Discharge status: Home / Self Care | Payer: No Typology Code available for payment source | Attending: Emergency Medicine | Admitting: Emergency Medicine

## 2023-04-08 ENCOUNTER — Other Ambulatory Visit: Payer: Self-pay

## 2023-04-08 DIAGNOSIS — M545 Low back pain, unspecified: Secondary | ICD-10-CM | POA: Diagnosis present

## 2023-04-08 DIAGNOSIS — Y92410 Unspecified street and highway as the place of occurrence of the external cause: Secondary | ICD-10-CM | POA: Diagnosis not present
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
## 2023-04-08 NOTE — Discharge Instructions (Signed)
You can take 650 mg of Tylenol and 600 mg of ibuprofen every 6 hours as needed for pain.  You can use topical pain relievers as well as ice and heat as needed.  Please return to the ED if you have any new or worsening symptoms.
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
## 2023-04-11 ENCOUNTER — Emergency Department
Admission: EM | Admit: 2023-04-11 | Discharge: 2023-04-11 | Disposition: A | Discharge status: Home / Self Care | Payer: No Typology Code available for payment source | Attending: Emergency Medicine | Admitting: Emergency Medicine

## 2023-04-11 ENCOUNTER — Encounter: Payer: Self-pay | Admitting: Emergency Medicine

## 2023-04-11 ENCOUNTER — Emergency Department: Payer: No Typology Code available for payment source

## 2023-04-11 ENCOUNTER — Other Ambulatory Visit: Payer: Self-pay

## 2023-04-11 DIAGNOSIS — Y92410 Unspecified street and highway as the place of occurrence of the external cause: Secondary | ICD-10-CM | POA: Insufficient documentation

## 2023-04-11 DIAGNOSIS — S3992XA Unspecified injury of lower back, initial encounter: Secondary | ICD-10-CM | POA: Diagnosis present

## 2023-04-11 DIAGNOSIS — S39012A Strain of muscle, fascia and tendon of lower back, initial encounter: Secondary | ICD-10-CM | POA: Diagnosis not present

## 2023-04-11 MED ORDER — BACLOFEN 10 MG PO TABS: 10.0000 mg | ORAL_TABLET | Freq: Three times a day (TID) | ORAL | 0 refills | Status: AC

## 2023-04-11 MED ORDER — MELOXICAM 15 MG PO TABS
15.0000 mg | ORAL_TABLET | Freq: Every day | ORAL | 2 refills | Status: AC
Start: 2023-04-11 — End: 2024-04-10
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
## 2023-04-11 NOTE — Discharge Instructions (Signed)
Apply ice to the lower back.  Take medication as prescribed.  Look at [facility] for your official result.  Your x-ray images are reassuring
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
## 2023-08-12 IMAGING — DX DG HAND COMPLETE 3+V*L*
3 series · 3 of 3 positions shown · non-contrast
Comparison: None.

CLINICAL DATA: Left hand pain, specifically left ring and middle
finger

EXAM:
LEFT HAND - COMPLETE 3+ VIEW

[hand ap]
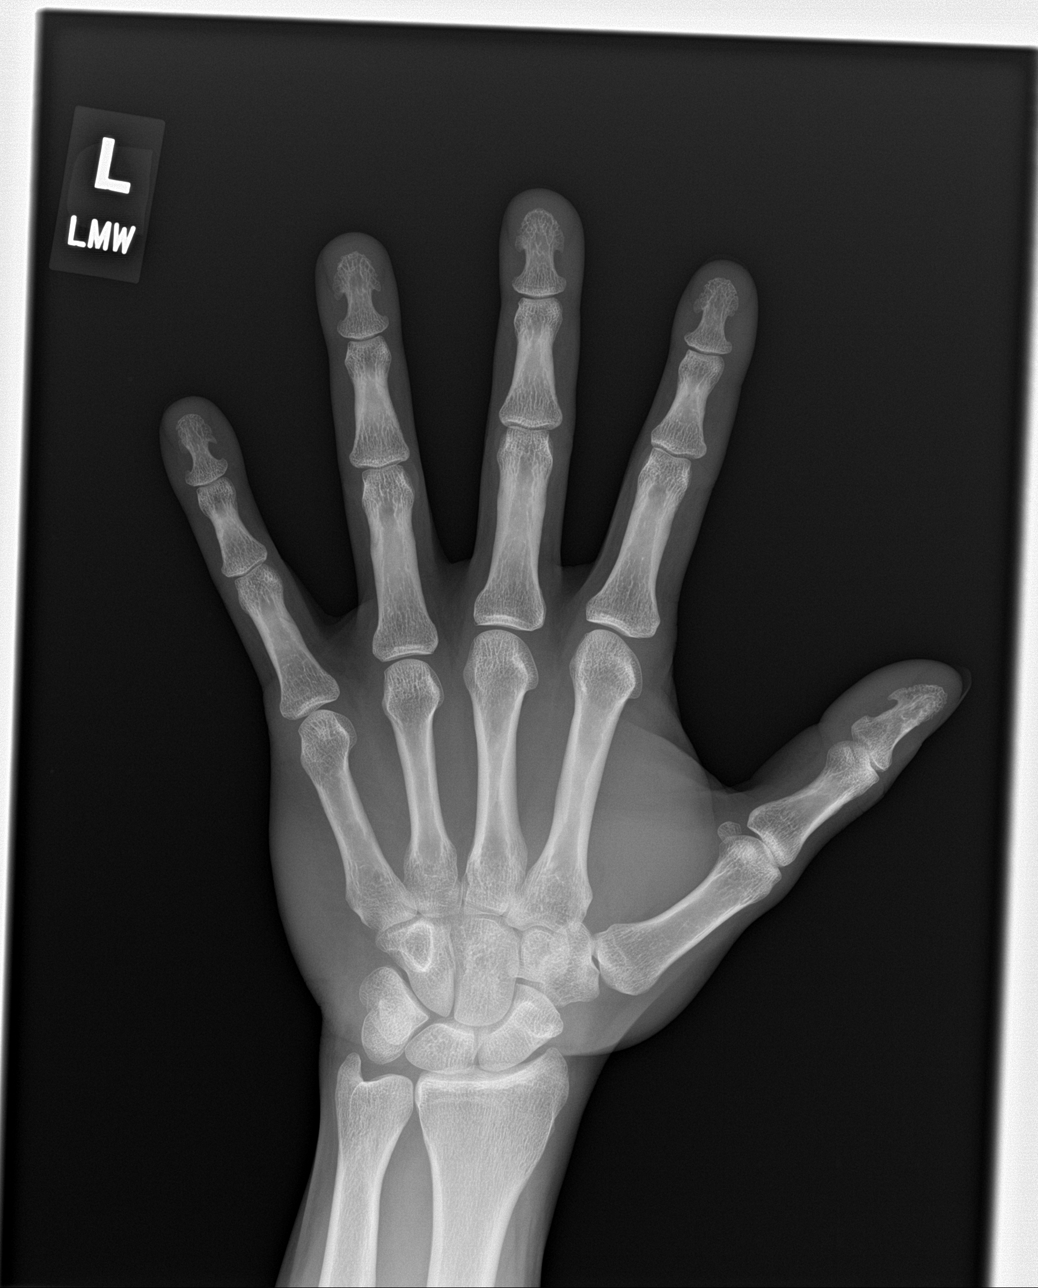

[hand obl]
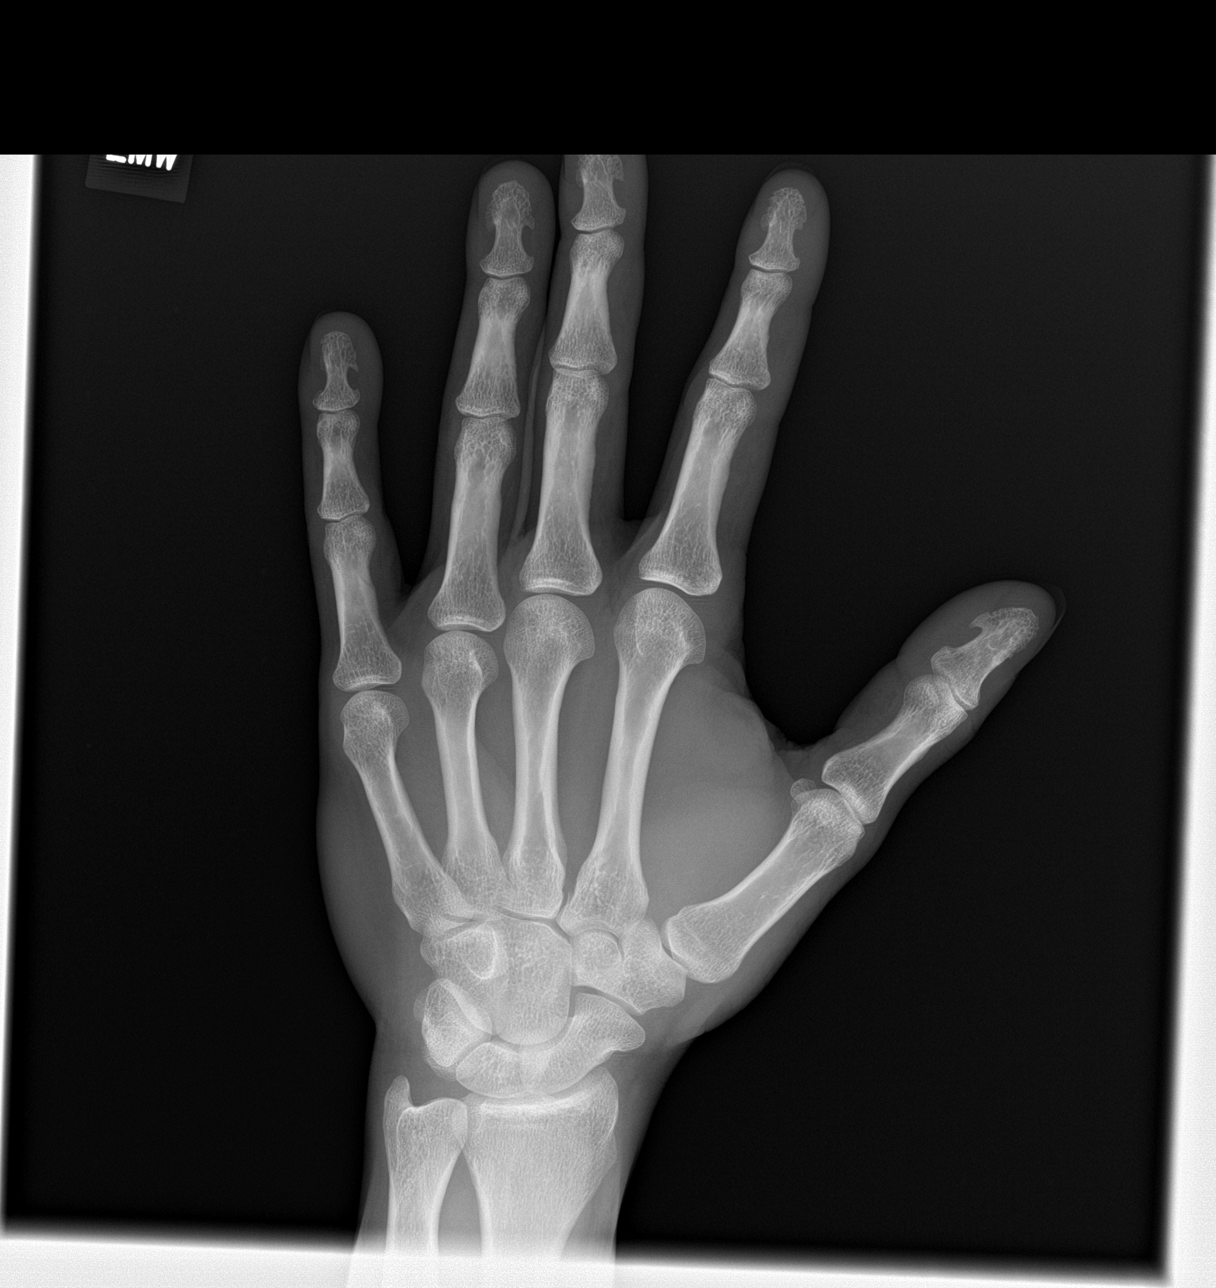

[hand lat]
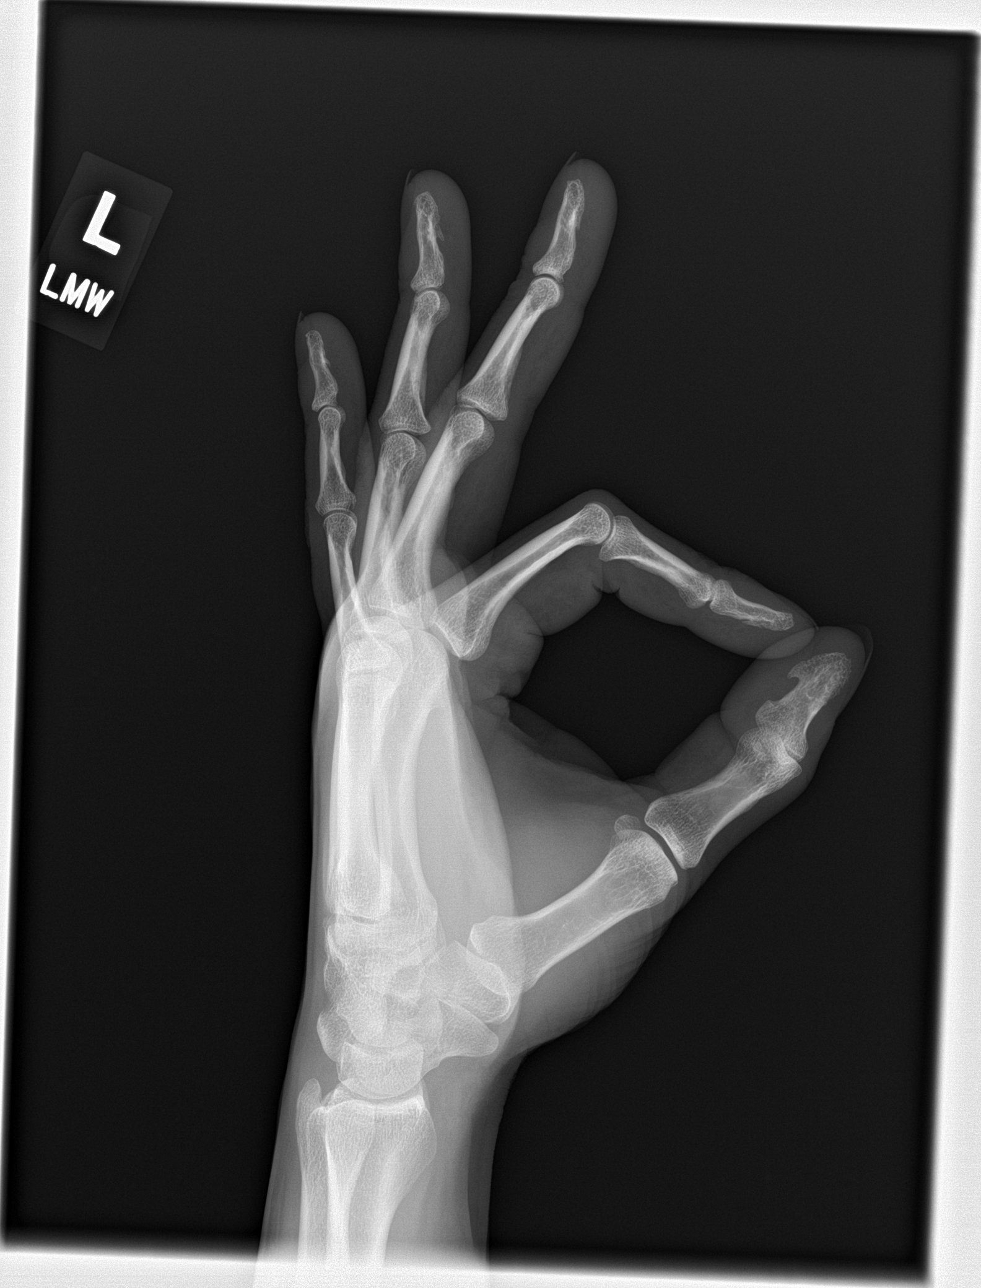

[3 of 3 positions shown; findings below may reference images not displayed]

FINDINGS: There is no evidence of fracture or dislocation. There is no
evidence of arthropathy or other focal bone abnormality. Soft
tissues are unremarkable.
IMPRESSION: Negative.

## 2023-09-05 ENCOUNTER — Ambulatory Visit (INDEPENDENT_AMBULATORY_CARE_PROVIDER_SITE_OTHER): Payer: Self-pay | Admitting: Nurse Practitioner

## 2023-09-05 ENCOUNTER — Encounter: Payer: Self-pay | Admitting: Nurse Practitioner

## 2023-09-05 VITALS — BP 128/70 | HR 72 | Temp 98.0°F | Resp 18 | Ht 72.0 in | Wt 149.0 lb

## 2023-09-05 DIAGNOSIS — Z0289 Encounter for other administrative examinations: Secondary | ICD-10-CM

## 2023-09-05 DIAGNOSIS — J0140 Acute pansinusitis, unspecified: Secondary | ICD-10-CM | POA: Diagnosis not present

## 2023-09-05 MED ORDER — AMOXICILLIN-POT CLAVULANATE 875-125 MG PO TABS
1.0000 | ORAL_TABLET | Freq: Two times a day (BID) | ORAL | 0 refills | Status: AC
Start: 2023-09-05 — End: ?
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
